# Patient Record
Sex: Female | Born: 1985 | Race: Black or African American | Hispanic: No | Marital: Married | Smoking: Never smoker
Health system: Southern US, Community
[De-identification: ages and names within clinical notes are randomized; demographics above are authoritative.]

## PROBLEM LIST (undated history)

## (undated) ENCOUNTER — Inpatient Hospital Stay (HOSPITAL_COMMUNITY): Payer: Self-pay

## (undated) DIAGNOSIS — F32A Depression, unspecified: Secondary | ICD-10-CM

## (undated) DIAGNOSIS — E7800 Pure hypercholesterolemia, unspecified: Secondary | ICD-10-CM

## (undated) DIAGNOSIS — F419 Anxiety disorder, unspecified: Secondary | ICD-10-CM

## (undated) DIAGNOSIS — IMO0002 Reserved for concepts with insufficient information to code with codable children: Secondary | ICD-10-CM

## (undated) HISTORY — DX: Reserved for concepts with insufficient information to code with codable children: IMO0002

## (undated) HISTORY — DX: Depression, unspecified: F32.A

## (undated) HISTORY — DX: Pure hypercholesterolemia, unspecified: E78.00

## (undated) HISTORY — DX: Anxiety disorder, unspecified: F41.9

---
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
## 2006-10-15 DIAGNOSIS — R87619 Unspecified abnormal cytological findings in specimens from cervix uteri: Secondary | ICD-10-CM

## 2006-10-15 DIAGNOSIS — IMO0002 Reserved for concepts with insufficient information to code with codable children: Secondary | ICD-10-CM

## 2006-10-15 HISTORY — DX: Unspecified abnormal cytological findings in specimens from cervix uteri: R87.619

## 2006-10-15 HISTORY — DX: Reserved for concepts with insufficient information to code with codable children: IMO0002

## 2007-06-13 ENCOUNTER — Other Ambulatory Visit: Admission: RE | Admit: 2007-06-13 | Discharge: 2007-06-13 | Payer: Self-pay | Admitting: Family Medicine

## 2008-01-08 ENCOUNTER — Emergency Department (HOSPITAL_COMMUNITY): Admission: EM | Admit: 2008-01-08 | Discharge: 2008-01-08 | Payer: Self-pay | Admitting: Family Medicine

## 2008-11-09 ENCOUNTER — Emergency Department (HOSPITAL_COMMUNITY): Admission: EM | Admit: 2008-11-09 | Discharge: 2008-11-09 | Payer: Self-pay | Admitting: Emergency Medicine

## 2009-06-01 ENCOUNTER — Encounter (INDEPENDENT_AMBULATORY_CARE_PROVIDER_SITE_OTHER): Payer: Self-pay | Admitting: Obstetrics and Gynecology

## 2009-06-01 ENCOUNTER — Inpatient Hospital Stay (HOSPITAL_COMMUNITY): Admission: AD | Admit: 2009-06-01 | Discharge: 2009-06-03 | Payer: Self-pay | Admitting: Obstetrics and Gynecology

## 2011-01-20 LAB — COMPREHENSIVE METABOLIC PANEL
AST: 23 U/L (ref 0–37)
AST: 24 U/L (ref 0–37)
Albumin: 2 g/dL — ABNORMAL LOW (ref 3.5–5.2)
Albumin: 2.7 g/dL — ABNORMAL LOW (ref 3.5–5.2)
Alkaline Phosphatase: 93 U/L (ref 39–117)
BUN: 5 mg/dL — ABNORMAL LOW (ref 6–23)
CO2: 24 mEq/L (ref 19–32)
Calcium: 8.1 mg/dL — ABNORMAL LOW (ref 8.4–10.5)
Chloride: 105 mEq/L (ref 96–112)
Creatinine, Ser: 0.54 mg/dL (ref 0.4–1.2)
Creatinine, Ser: 0.64 mg/dL (ref 0.4–1.2)
GFR calc Af Amer: >60 mL/min (ref >60)
GFR calc non Af Amer: >60 mL/min (ref >60)
GFR calc non Af Amer: >60 mL/min (ref >60)
Potassium: 4.3 mEq/L (ref 3.5–5.1)
Sodium: 138 mEq/L (ref 135–145)
Total Bilirubin: 0.4 mg/dL (ref 0.3–1.2)
Total Protein: 5.1 g/dL — ABNORMAL LOW (ref 6.0–8.3)

## 2011-01-20 LAB — CBC
Hemoglobin: 12.9 g/dL (ref 12.0–15.0)
MCHC: 33 g/dL (ref 30.0–36.0)
MCHC: 34.2 g/dL (ref 30.0–36.0)
MCV: 78.8 fL (ref 78.0–100.0)
MCV: 79.4 fL (ref 78.0–100.0)
Platelets: 122 10*3/uL — ABNORMAL LOW (ref 150–400)
RBC: 4.96 MIL/uL (ref 3.87–5.11)
RDW: 15.4 % (ref 11.5–15.5)
WBC: 8.7 10*3/uL (ref 4.0–10.5)

## 2011-01-20 LAB — URINALYSIS, DIPSTICK ONLY
Bilirubin Urine: NEGATIVE
Nitrite: NEGATIVE
Protein, ur: NEGATIVE mg/dL
Urobilinogen, UA: 0.2 mg/dL (ref 0.0–1.0)

## 2011-01-20 LAB — RPR: RPR Ser Ql: NONREACTIVE

## 2011-02-27 NOTE — H&P (Signed)
Pamela Hall, Pamela Hall                  ACCOUNT NO.:  [id number]   MEDICAL RECORD NO.:  [id number]          PATIENT TYPE:  MAT   LOCATION:  [facility]                          FACILITY:  [facility]   PHYSICIAN:  Naima A. Dillard, M.D. DATE OF BIRTH:  [birth year]   DATE OF ADMISSION:  06/01/2009  DATE OF DISCHARGE:                              HISTORY & PHYSICAL   Ms. Doles is a 25 year old single black female gravida 2, para 0-0-1-0  at 38-2/7 weeks per an [facility] of June 13, 2009, who presents with a chief  complaint of spontaneous rupture of membranes at 5:15 a.m. while at  work.  She reports clear fluid with some pink tinge, positive fetal  movement, positive contractions, positive GBS.  No PIH or UTI signs or  symptoms, no respiratory complaints or fever.  Positive nausea.  Followed by CNM service at [facility].  Her history is remarkable for;  1. Borderline blood pressure today since arrival and occasional      borderline at the office during third trimester.  2. GBS positive.  3. History of abnormal Pap in 2008, but colposcopy within normal      limits.  4. TAB x1.  5. History of some irregular cycles at times.  6. History of STDs.  7. 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  8. Obese.  9. First trimester spotting after intercourse.   PRENATAL LABS:  Blood type is O+, Rh antibody screen negative.  Sickle  cell screen negative.  RPR was nonreactive.  Rubella titer immune.  Hepatitis surface antigen negative, HIV nonreactive.  Hemoglobin at that  time which was November 22, 2008, was 13.3, hematocrit 40.2, platelets  167.  Pap was done on November 22, 2008, as well and it was within  normal limits and gonorrhea and chlamydia cultures were negative.  GBS  culture positive in her third trimester.  She did have an elevated  Glucola which was 138, however, her 3-hour Glucola was all within normal  limits.  Hemoglobin that was done at time of her Glucola was 12.4.   ALLERGIES:  SHE DENIES  MEDICATION OR LATEX ALLERGIES.  NO OTHER  SENSITIVITIES.   MENSTRUAL HISTORY:  Menarche at age 68.  Reported monthly cycles, but  sometimes irregular at times.  She reported an LMP of September 06, 2008,  giving her a best [facility] of June 13, 2009.   OBSTETRICAL HISTORY:  Rhett Bannister 1 was an induced abortion at [redacted] weeks  gestation in April 2002.  Gravida 2 is current pregnancy.   PAST MEDICAL HISTORY:  She is A positive.  Contraception in the past,  she has used Depo-Provera, Ortho Evra patch and birth control pills.  Abnormal Pap in 2008, colposcopy was normal and was done at [facility].  Treated for chlamydia in 2000.  Treated for Trichomonas in  2000.  She does have some issues with frequent yeast infections and  bacterial vaginosis.  She reports varicella as a child, normal childhood  illnesses.  Does report receiving hepatitis B vaccine for her career.  Her only surgery is wisdom teeth extraction x4.   GENETIC HISTORY:  Remarkable for father of the baby having sickle cell  trait.  The father of the baby's daughter has sickle cell disease.   FAMILY HISTORY:  Maternal grandmother pacemaker.  Her mom has had a  heart attack and that was at age 61.  Mom and maternal grandmother  hypertension and on medication.  Maternal grandmother type 2 diabetes.  Her mom had gestational diabetes.  Mom bipolar and schizophrenia.  Mom  has had issues with drug abuse, including illicit drug use, alcohol and  nicotine.   SOCIAL HISTORY:  She is a single [place] American female.  She reports  [place] affiliation for her faith.  The father of the baby's name is  Leeanne Rio, he is involved and supportive.  He has a high school  education and is a delivery driver.  The patient has her bachelor's  degree and is a Designer, jewellery in Neuro ICU at [employer].  She reported mixed drinks before pregnancy before knowing,  otherwise denied tobacco or illicit drug use.   HISTORY  OF PRESENT PREGNANCY:  She entered care on November 01, 2008, for  her new OB interview and was around [redacted] weeks pregnant.  She reported her  pre-gravid weight 203, height was 5 feet 8.  Her BMI is 30.9.  She had  some first trimester spotting after sex.  She returned for her new OB  workup on November 22, 2008.  Her weight was 211, blood pressure was  120/100 at that time, recheck was within normal limits.  Prenatal labs:  Pap and cultures were done that day as well.  Sickle cell screen and all  were negative.  She had her first trimester screen done on December 08, 2008, and it was within normal limits.  AFP was also done and was within  normal limits, it was done on December 24, 2008.  At 19-3/7 weeks, she had  her anatomy ultrasound.  SIUP, size was equal to her LMP dating.  They  are expecting a boy.  Cervical length 3.31 cm.  Estimated fetal weight  was 48th percentile.  AFI was normal.  Posterior placenta grade 0.  Part  of her cardiac anatomy was not seen at that time, so she did return at  23 weeks and was able to observe all of that anatomy and it was within  normal limits.  She had some trouble with insomnia and was prescribed  Ambien at 19 weeks.  She also complained of some facial and back acne  and was prescribed erythromycin gel.  She does desire Marina postpartum.  Plans to breast feed and does desire circ.  At 30 weeks, she did have  another borderline blood pressure of 134/80, recheck was again within  normal limits.  Her weight at that time was 223.  She had her Glucola  and it was elevated at 138.  She did have her 3-hour Glucola and it was  within normal limits.  Around 32 weeks, she did have another borderline  blood pressure of 130/70, complained of seeing some spots while she was  at work the night before, but no other PIH signs or symptoms.  She had  an ultrasound at 34/3 for size less than dates.  She was measuring about  a week behind and did have a normal ultrasound  with estimated fetal  weight in the 74th percentile.  Fluid was 55th percentile.  Cervix was  still 3.45 cm.  GBS was done at 36/3, it was positive.  Blood pressures  have been within normal limits following 34-week visit when it was  132/60, until her presentation today.   OBJECTIVE:  VITAL SIGNS:  On admission, her blood pressure was 148/82  initially, heart rate 88, temperature 97.6, respirations 20.  Recheck on  her blood pressure was 138/78, heart rate was 91.  CBC showed white  count 8.7, hemoglobin 12.9, hematocrit 39.1, platelets were 157.  Fetal  heart rate 130s, minimal to moderate variability, no decels, not yet  reactive.  Toco showed contractions every 2-3 minutes which were  moderate on palpation.  She did have good resting tone.  GENERAL:  She was in discomfort, but alert and oriented x3.  HEENT:  Within normal limits.  CARDIOVASCULAR:  Regular rate and rhythm without murmur.  LUNGS:  Clear to auscultation bilaterally.  ABDOMEN:  Soft, nontender and gravid.  PELVIC:  Positive ferning, clear fluid, pink tinge on her pad.  Cervix  was 3+ centimeters 80%, -1, vertex, anterior cervix.  EXTREMITIES:  1+ pitting edema bilateral lower extremities.  She was  hyperreflexic with one beat of clonus bilaterally.   IMPRESSION:  1. Intrauterine pregnancy at 38-2/7 weeks.  2. Early labor.  3. Spontaneous rupture of membranes at 5:15 a.m.  4. Group B strep positive.  5. Desires epidural.  6. Borderline blood pressures.   PLAN:  1. Admit to birthing suites with Dr. Normand Sloop as attending physician.  2. Routine L and D orders with the addition of CMET, LDH, uric acid to      her labs, as well as will send a UA after her Foley is placed      following epidural.  3. Epidural as soon as possible.  4. Continue to observe blood pressures closely.  5. Pitocin IUPC p.r.n. augmentation.  6. Penicillin G per GBS protocol.      Candice Soldotna, CNM      Naima A. Normand Sloop,  M.D.  Electronically Signed    [facility]/MEDQ  D:  06/01/2009  T:  06/01/2009  Job:  [id number]

## 2011-07-09 LAB — WET PREP, GENITAL
Clue Cells Wet Prep HPF POC: NONE SEEN
Trich, Wet Prep: NONE SEEN
Yeast Wet Prep HPF POC: NONE SEEN

## 2011-07-09 LAB — GC/CHLAMYDIA PROBE AMP, GENITAL
Chlamydia, DNA Probe: NEGATIVE
GC Probe Amp, Genital: NEGATIVE

## 2011-10-16 NOTE — L&D Delivery Note (Signed)
Delivery Note At 3:41 AM a viable female was delivered via  (Presentation: LOA).  Easy delivery of the head with nuchal arm, loose nuchal cord x 1 slipped,, easy delivery of the shoulders, bulb suction mouth and nares, baby placed on pt abdomen cord doubly clamped and cut by support person. APGAR: 9, 9; weight 5 lb 14.4 oz (2676 g).   Placenta status: Intact, Spontaneous 3 vessel. Plans PP BTL to leave epidural in.  Anesthesia: Epidural  Episiotomy:  Lacerations:  Suture Repair: 4.0 monocryl 1 stitch for good hemostasis Est. Blood Loss (mL):   Mom to postpartum.  Baby to rooming in.  Sylas Twombly 09/29/2012, 4:13 AM

## 2012-02-25 ENCOUNTER — Telehealth: Payer: Self-pay | Admitting: Obstetrics and Gynecology

## 2012-02-25 MED ORDER — ONDANSETRON 8 MG PO TBDP: 8.0000 mg | ORAL_TABLET | Freq: Three times a day (TID) | ORAL | Status: DC | PRN

## 2012-02-25 NOTE — Telephone Encounter (Signed)
Spoke with pt rgs msg pt ~ 6 weeks c/o nausea vomiting unable to keep anything down tries gingerale and saltines still no relief advised pt will consult with provider and call her back pt voice understanding. Consult with [facility] pt can have rx for Zofran 8 mg one po q 8 hrs disp 20 3 rf informed pt rx sent to pharm pt voice understanding

## 2012-02-25 NOTE — Telephone Encounter (Signed)
New ob/no cht/nurse pool

## 2012-03-07 ENCOUNTER — Ambulatory Visit (INDEPENDENT_AMBULATORY_CARE_PROVIDER_SITE_OTHER): Payer: BC Managed Care – PPO | Admitting: Obstetrics and Gynecology

## 2012-03-07 DIAGNOSIS — Z331 Pregnant state, incidental: Secondary | ICD-10-CM

## 2012-03-07 LAB — POCT URINALYSIS DIPSTICK
Bilirubin, UA: 1
Blood, UA: NEGATIVE
Nitrite, UA: NEGATIVE
Urobilinogen, UA: NEGATIVE
pH, UA: 5

## 2012-03-08 LAB — PRENATAL PANEL VII
Basophils Relative: 1 % (ref 0–1)
Eosinophils Absolute: 0.1 10*3/uL (ref 0.0–0.7)
Hemoglobin: 14.2 g/dL (ref 12.0–15.0)
Hepatitis B Surface Ag: NEGATIVE
MCH: 27.3 pg (ref 26.0–34.0)
MCHC: 34.1 g/dL (ref 30.0–36.0)
Monocytes Relative: 14 % — ABNORMAL HIGH (ref 3–12)
Neutrophils Relative %: 55 % (ref 43–77)
RDW: 13.8 % (ref 11.5–15.5)
Rh Type: POSITIVE

## 2012-03-09 LAB — CULTURE, OB URINE
Colony Count: NO GROWTH
Organism ID, Bacteria: NO GROWTH

## 2012-03-11 LAB — HEMOGLOBINOPATHY EVALUATION
Hemoglobin Other: 0 %
Hgb A2 Quant: 2.5 % (ref 2.2–3.2)
Hgb A: 97.5 % (ref 96.8–97.8)
Hgb S Quant: 0 %

## 2012-03-25 ENCOUNTER — Encounter: Payer: Self-pay | Admitting: Obstetrics and Gynecology

## 2012-03-25 ENCOUNTER — Ambulatory Visit (INDEPENDENT_AMBULATORY_CARE_PROVIDER_SITE_OTHER): Payer: BC Managed Care – PPO | Admitting: Obstetrics and Gynecology

## 2012-03-25 VITALS — BP 102/56 | Ht 68.0 in | Wt 226.0 lb

## 2012-03-25 DIAGNOSIS — Z331 Pregnant state, incidental: Secondary | ICD-10-CM | POA: Insufficient documentation

## 2012-03-25 DIAGNOSIS — B009 Herpesviral infection, unspecified: Secondary | ICD-10-CM | POA: Insufficient documentation

## 2012-03-25 DIAGNOSIS — Z2089 Contact with and (suspected) exposure to other communicable diseases: Secondary | ICD-10-CM

## 2012-03-25 LAB — POCT URINALYSIS DIPSTICK
Blood, UA: NEGATIVE
Ketones, UA: NEGATIVE
Protein, UA: NEGATIVE
Spec Grav, UA: 1.015
Urobilinogen, UA: NEGATIVE

## 2012-03-25 LAB — POCT WET PREP (WET MOUNT)
Clue Cells Wet Prep Whiff POC: POSITIVE
pH: 5

## 2012-03-25 MED ORDER — TERCONAZOLE 0.4 % VA CREA: 1.0000 | TOPICAL_CREAM | Freq: Every day | VAGINAL | Status: AC

## 2012-03-25 MED ORDER — METRONIDAZOLE 500 MG PO TABS: 500.0000 mg | ORAL_TABLET | Freq: Two times a day (BID) | ORAL | Status: AC

## 2012-03-25 NOTE — Progress Notes (Signed)
Last pap: 10/2011-WNL C/O nausea, vomiting & irritability Declines genetic testing

## 2012-03-26 ENCOUNTER — Encounter: Payer: Self-pay | Admitting: Obstetrics and Gynecology

## 2012-03-26 LAB — GC/CHLAMYDIA PROBE AMP, GENITAL: Chlamydia, DNA Probe: NEGATIVE

## 2012-03-26 NOTE — Progress Notes (Signed)
Patient ID: Pamela Hall, female   DOB: 17-Apr-1986, 26 y.o.   MRN: [id number] Pamela Hall is a 26 y.o. female presenting for new ob visit. Planned pregnancy with certain LMP 3/24. 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 Not taking PNV yet has RX.   OB History    Grav Para Term Preterm Abortions TAB SAB Ect Mult Living   3 1 1  1           Past Medical History  Diagnosis Date  . Abnormal Pap smear 2008    COLPO;WAS NORMAL;LAST PAP 10/2011  . Infection     YEAST INF;NOT FREQ  . Infection     BV;MAY GET FREQ  . 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 Wisdom tooth extraction 2003    ALL 4 EXTRACTED   Family History: family history includes Anesthesia problems in her maternal grandmother; Bipolar disorder in her mother; Cancer in her maternal grandmother; Diabetes type II in her maternal grandmother; Heart attack in her mother; Heart disease in her maternal grandmother and mother; Hypertension in her maternal grandmother; and Schizophrenia in her mother. Social History:  reports that she has never smoked. She has never used smokeless tobacco. She reports that she does not drink alcohol or use illicit drugs.  @ROS @    Blood pressure 102/56, height 5\' 8"  (1.727 m), weight 226 lb (102.513 kg), last menstrual period 01/06/2012. Physical exam: Calm, no distress, HEENT wnl lungs clear bilaterally, AP RRR, breasts bilaterally no masses, dimpling, or drainage, abd soft, gravid, nt, bowel sounds active, abdomen nontender Normal hair distrubition mons pubis,  EGBUS WNL, sterile speculum exam,  vagina pink, moist normal rugae, uterus high cerix LTC, no cervical motion tenderness, No adnexal masses or tenderness Scant white discharge DTR +2 no clonus No edema to lower extremities Wet  prep +hyphae, +clue, neg trich Prenatal labs: ABO, Rh: O/POS/-- (05/24 1109) Antibody: NEG (05/24 1109) Rubella:   RPR: NON REAC (05/24 1109)  HBsAg: NEGATIVE (05/24 1109)  HIV: NON REACTIVE (05/24 1109)  GBS:    Pap Jan 2013 WNL Sickle cell neg Assessment/Plan: 11 IUP No ketonuria  GC/CHL pending F/O 2 weeks for FHTS. Declines Genetic testing, Rx flagyl, gynezole discussed may wait to take until N,V resolves, nutrition and po hydration reviewed regarding N,V and s/s to report if worsening. Collaboration with Dr. Normand Sloop. Pamela Hall 03/26/2012, 11:16 AM

## 2012-04-06 ENCOUNTER — Encounter (HOSPITAL_COMMUNITY): Payer: Self-pay

## 2012-04-06 ENCOUNTER — Inpatient Hospital Stay (HOSPITAL_COMMUNITY)
Admission: AD | Admit: 2012-04-06 | Discharge: 2012-04-06 | Disposition: A | Discharge status: Home / Self Care | Payer: BC Managed Care – HMO | Source: Ambulatory Visit | Attending: Obstetrics and Gynecology | Admitting: Obstetrics and Gynecology

## 2012-04-06 DIAGNOSIS — O99891 Other specified diseases and conditions complicating pregnancy: Secondary | ICD-10-CM | POA: Insufficient documentation

## 2012-04-06 DIAGNOSIS — R1030 Lower abdominal pain, unspecified: Secondary | ICD-10-CM

## 2012-04-06 DIAGNOSIS — N949 Unspecified condition associated with female genital organs and menstrual cycle: Secondary | ICD-10-CM

## 2012-04-06 DIAGNOSIS — Z331 Pregnant state, incidental: Secondary | ICD-10-CM

## 2012-04-06 DIAGNOSIS — R109 Unspecified abdominal pain: Secondary | ICD-10-CM | POA: Insufficient documentation

## 2012-04-06 LAB — URINALYSIS, ROUTINE W REFLEX MICROSCOPIC
Glucose, UA: NEGATIVE mg/dL
Hgb urine dipstick: NEGATIVE
Ketones, ur: NEGATIVE mg/dL
Leukocytes, UA: NEGATIVE
Protein, ur: NEGATIVE mg/dL
pH: 8 (ref 5.0–8.0)

## 2012-04-06 NOTE — Discharge Instructions (Signed)
ABCs of Pregnancy A Antepartum care is very important. Be sure you see your doctor and get prenatal care as soon as you think you are pregnant. At this time, you will be tested for infection, genetic abnormalities and potential problems with you and the pregnancy. This is the time to discuss diet, exercise, work, medications, labor, pain medication during labor and the possibility of a cesarean delivery. Ask any questions that may concern you. It is important to see your doctor regularly throughout your pregnancy. Avoid exposure to toxic substances and chemicals - such as cleaning solvents, lead and mercury, some insecticides, and paint. Pregnant women should avoid exposure to paint fumes, and fumes that cause you to feel ill, dizzy or faint. When possible, it is a good idea to have a pre-pregnancy consultation with your caregiver to begin some important recommendations your caregiver suggests such as, taking folic acid, exercising, quitting smoking, avoiding alcoholic beverages, etc. B Breastfeeding is the healthiest choice for both you and your baby. It has many nutritional benefits for the baby and health benefits for the mother. It also creates a very tight and loving bond between the baby and mother. Talk to your doctor, your family and friends, and your employer about how you choose to feed your baby and how they can support you in your decision. Not all birth defects can be prevented, but a woman can take actions that may increase her chance of having a healthy baby. Many birth defects happen very early in pregnancy, sometimes before a woman even knows she is pregnant. Birth defects or abnormalities of any child in your or the father's family should be discussed with your caregiver. Get a good support bra as your breast size changes. Wear it especially when you exercise and when nursing.  C Celebrate the news of your pregnancy with the your spouse/father and family. Childbirth classes are helpful to  take for you and the spouse/father because it helps to understand what happens during the pregnancy, labor and delivery. Cesarean delivery should be discussed with your doctor so you are prepared for that possibility. The pros and cons of circumcision if it is a boy, should be discussed with your pediatrician. Cigarette smoking during pregnancy can result in low birth weight babies. It has been associated with infertility, miscarriages, tubal pregnancies, infant death (mortality) and poor health (morbidity) in childhood. Additionally, cigarette smoking may cause long-term learning disabilities. If you smoke, you should try to quit before getting pregnant and not smoke during the pregnancy. Secondary smoke may also harm a mother and her developing baby. It is a good idea to ask people to stop smoking around you during your pregnancy and after the baby is born. Extra calcium is necessary when you are pregnant and is found in your prenatal vitamin, in dairy products, green leafy vegetables and in calcium supplements. D A healthy diet according to your current weight and height, along with vitamins and mineral supplements should be discussed with your caregiver. Domestic abuse or violence should be made known to your doctor right away to get the situation corrected. Drink more water when you exercise to keep hydrated. Discomfort of your back and legs usually develops and progresses from the middle of the second trimester through to delivery of the baby. This is because of the enlarging baby and uterus, which may also affect your balance. Do not take illegal drugs. Illegal drugs can seriously harm the baby and you. Drink extra fluids (water is best) throughout pregnancy to help   your body keep up with the increases in your blood volume. Drink at least 6 to 8 glasses of water, fruit juice, or milk each day. A good way to know you are drinking enough fluid is when your urine looks almost like clear water or is very light  yellow.  E Eat healthy to get the nutrients you and your unborn baby need. Your meals should include the five basic food groups. Exercise (30 minutes of light to moderate exercise a day) is important and encouraged during pregnancy, if there are no medical problems or problems with the pregnancy. Exercise that causes discomfort or dizziness should be stopped and reported to your caregiver. Emotions during pregnancy can change from being ecstatic to depression and should be understood by you, your partner and your family. F Fetal screening with ultrasound, amniocentesis and monitoring during pregnancy and labor is common and sometimes necessary. Take 400 micrograms of folic acid daily both before, when possible, and during the first few months of pregnancy to reduce the risk of birth defects of the brain and spine. All women who could possibly become pregnant should take a vitamin with folic acid, every day. It is also important to eat a healthy diet with fortified foods (enriched grain products, including cereals, rice, breads, and pastas) and foods with natural sources of folate (orange juice, green leafy vegetables, beans, peanuts, broccoli, asparagus, peas, and lentils). The father should be involved with all aspects of the pregnancy including, the prenatal care, childbirth classes, labor, delivery, and postpartum time. Fathers may also have emotional concerns about being a father, financial needs, and raising a family. G Genetic testing should be done appropriately. It is important to know your family and the father's history. If there have been problems with pregnancies or birth defects in your family, report these to your doctor. Also, genetic counselors can talk with you about the information you might need in making decisions about having a family. You can call a major medical center in your area for help in finding a board-certified genetic counselor. Genetic testing and counseling should be done  before pregnancy when possible, especially if there is a history of problems in the mother's or father's family. Certain ethnic backgrounds are more at risk for genetic defects. H Get familiar with the hospital where you will be having your baby. Get to know how long it takes to get there, the labor and delivery area, and the hospital procedures. Be sure your medical insurance is accepted there. Get your home ready for the baby including, clothes, the baby's room (when possible), furniture and car seat. Hand washing is important throughout the day, especially after handling raw meat and poultry, changing the baby's diaper or using the bathroom. This can help prevent the spread of many bacteria and viruses that cause infection. Your hair may become dry and thinner, but will return to normal a few weeks after the baby is born. Heartburn is a common problem that can be treated by taking antacids recommended by your caregiver, eating smaller meals 5 or 6 times a day, not drinking liquids when eating, drinking between meals and raising the head of your bed 2 to 3 inches. I Insurance to cover you, the baby, doctor and hospital should be reviewed so that you will be prepared to pay any costs not covered by your insurance plan. If you do not have medical insurance, there are usually clinics and services available for you in your community. Take 30 milligrams of iron during   your pregnancy as prescribed by your doctor to reduce the risk of low red blood cells (anemia) later in pregnancy. All women of childbearing age should eat a diet rich in iron. J There should be a joint effort for the mother, father and any other children to adapt to the pregnancy financially, emotionally, and psychologically during the pregnancy. Join a support group for moms-to-be. Or, join a class on parenting or childbirth. Have the family participate when possible. K Know your limits. Let your caregiver know if you experience any of the  following:   Pain of any kind.   Strong cramps.   You develop a lot of weight in a short period of time (5 pounds in 3 to 5 days).   Vaginal bleeding, leaking of amniotic fluid.   Headache, vision problems.   Dizziness, fainting, shortness of breath.   Chest pain.   Fever of 102 F (38.9 C) or higher.   Gush of clear fluid from your vagina.   Painful urination.   Domestic violence.   Irregular heartbeat (palpitations).   Rapid beating of the heart (tachycardia).   Constant feeling sick to your stomach (nauseous) and vomiting.   Trouble walking, fluid retention (edema).   Muscle weakness.   If your baby has decreased activity.   Persistent diarrhea.   Abnormal vaginal discharge.   Uterine contractions at 20-minute intervals.   Back pain that travels down your leg.  L Learn and practice that what you eat and drink should be in moderation and healthy for you and your baby. Legal drugs such as alcohol and caffeine are important issues for pregnant women. There is no safe amount of alcohol a woman can drink while pregnant. Fetal alcohol syndrome, a disorder characterized by growth retardation, facial abnormalities, and central nervous system dysfunction, is caused by a woman's use of alcohol during pregnancy. Caffeine, found in tea, coffee, soft drinks and chocolate, should also be limited. Be sure to read labels when trying to cut down on caffeine during pregnancy. More than 200 foods, beverages, and over-the-counter medications contain caffeine and have a high salt content! There are coffees and teas that do not contain caffeine. M Medical conditions such as diabetes, epilepsy, and high blood pressure should be treated and kept under control before pregnancy when possible, but especially during pregnancy. Ask your caregiver about any medications that may need to be changed or adjusted during pregnancy. If you are currently taking any medications, ask your caregiver if it  is safe to take them while you are pregnant or before getting pregnant when possible. Also, be sure to discuss any herbs or vitamins you are taking. They are medicines, too! Discuss with your doctor all medications, prescribed and over-the-counter, that you are taking. During your prenatal visit, discuss the medications your doctor may give you during labor and delivery. N Never be afraid to ask your doctor or caregiver questions about your health, the progress of the pregnancy, family problems, stressful situations, and recommendation for a pediatrician, if you do not have one. It is better to take all precautions and discuss any questions or concerns you may have during your office visits. It is a good idea to write down your questions before you visit the doctor. O Over-the-counter cough and cold remedies may contain alcohol or other ingredients that should be avoided during pregnancy. Ask your caregiver about prescription, herbs or over-the-counter medications that you are taking or may consider taking while pregnant.  P Physical activity during pregnancy can   benefit both you and your baby by lessening discomfort and fatigue, providing a sense of well-being, and increasing the likelihood of early recovery after delivery. Light to moderate exercise during pregnancy strengthens the belly (abdominal) and back muscles. This helps improve posture. Practicing yoga, walking, swimming, and cycling on a stationary bicycle are usually safe exercises for pregnant women. Avoid scuba diving, exercise at high altitudes (over 3000 feet), skiing, horseback riding, contact sports, etc. Always check with your doctor before beginning any kind of exercise, especially during pregnancy and especially if you did not exercise before getting pregnant. Q Queasiness, stomach upset and morning sickness are common during pregnancy. Eating a couple of crackers or dry toast before getting out of bed. Foods that you normally love may  make you feel sick to your stomach. You may need to substitute other nutritious foods. Eating 5 or 6 small meals a day instead of 3 large ones may make you feel better. Do not drink with your meals, drink between meals. Questions that you have should be written down and asked during your prenatal visits. R Read about and make plans to baby-proof your home. There are important tips for making your home a safer environment for your baby. Review the tips and make your home safer for you and your baby. Read food labels regarding calories, salt and fat content in the food. S Saunas, hot tubs, and steam rooms should be avoided while you are pregnant. Excessive high heat may be harmful during your pregnancy. Your caregiver will screen and examine you for sexually transmitted diseases and genetic disorders during your prenatal visits. Learn the signs of labor. Sexual relations while pregnant is safe unless there is a medical or pregnancy problem and your caregiver advises against it. T Traveling long distances should be avoided especially in the third trimester of your pregnancy. If you do have to travel out of state, be sure to take a copy of your medical records and medical insurance plan with you. You should not travel long distances without seeing your doctor first. Most airlines will not allow you to travel after 36 weeks of pregnancy. Toxoplasmosis is an infection caused by a parasite that can seriously harm an unborn baby. Avoid eating undercooked meat and handling cat litter. Be sure to wear gloves when gardening. Tingling of the hands and fingers is not unusual and is due to fluid retention. This will go away after the baby is born. U Womb (uterus) size increases during the first trimester. Your kidneys will begin to function more efficiently. This may cause you to feel the need to urinate more often. You may also leak urine when sneezing, coughing or laughing. This is due to the growing uterus pressing  against your bladder, which lies directly in front of and slightly under the uterus during the first few months of pregnancy. If you experience burning along with frequency of urination or bloody urine, be sure to tell your doctor. The size of your uterus in the third trimester may cause a problem with your balance. It is advisable to maintain good posture and avoid wearing high heels during this time. An ultrasound of your baby may be necessary during your pregnancy and is safe for you and your baby. V Vaccinations are an important concern for pregnant women. Get needed vaccines before pregnancy. Center for Disease Control (www.cdc.gov) has clear guidelines for the use of vaccines during pregnancy. Review the list, be sure to discuss it with your doctor. Prenatal vitamins are helpful   and healthy for you and the baby. Do not take extra vitamins except what is recommended. Taking too much of certain vitamins can cause overdose problems. Continuous vomiting should be reported to your caregiver. Varicose veins may appear especially if there is a family history of varicose veins. They should subside after the delivery of the baby. Support hose helps if there is leg discomfort. W Being overweight or underweight during pregnancy may cause problems. Try to get within 15 pounds of your ideal weight before pregnancy. Remember, pregnancy is not a time to be dieting! Do not stop eating or start skipping meals as your weight increases. Both you and your baby need the calories and nutrition you receive from a healthy diet. Be sure to consult with your doctor about your diet. There is a formula and diet plan available depending on whether you are overweight or underweight. Your caregiver or nutritionist can help and advise you if necessary. X Avoid X-rays. If you must have dental work or diagnostic tests, tell your dentist or physician that you are pregnant so that extra care can be taken. X-rays should only be taken when  the risks of not taking them outweigh the risk of taking them. If needed, only the minimum amount of radiation should be used. When X-rays are necessary, protective lead shields should be used to cover areas of the body that are not being X-rayed. Y Your baby loves you. Breastfeeding your baby creates a loving and very close bond between the two of you. Give your baby a healthy environment to live in while you are pregnant. Infants and children require constant care and guidance. Their health and safety should be carefully watched at all times. After the baby is born, rest or take a nap when the baby is sleeping. Z Get your ZZZs. Be sure to get plenty of rest. Resting on your side as often as possible, especially on your left side is advised. It provides the best circulation to your baby and helps reduce swelling. Try taking a nap for 30 to 45 minutes in the afternoon when possible. After the baby is born rest or take a nap when the baby is sleeping. Try elevating your feet for that amount of time when possible. It helps the circulation in your legs and helps reduce swelling.  Most information courtesy of the CDC. Document Released: 10/01/2005 Document Revised: 09/20/2011 Document Reviewed: 06/15/2009 [employer] Patient Information 2012 [employer]. 

## 2012-04-06 NOTE — MAU Note (Signed)
Reported to CNM patient G3P1 [redacted] weeks gestation, presents with c/o pressure and cramping for several days which has gotten progressively worse, denies vaginal bleeding or discharge, CNM to come to MAU to evaluate patient.

## 2012-04-06 NOTE — Progress Notes (Signed)
History   cramping for a week days "feels like when I was 36 weeks with my last pregnancy like it is all going to fall out, Nausea has improved, no vomiting, diarrhea, fever, no vag bleeding"  Chief Complaint  Patient presents with  . Abdominal Pain   @SFHPI @  OB History    Grav Para Term Preterm Abortions TAB SAB Ect Mult Living   3 1 1  1     1       Past Medical History  Diagnosis Date  . Abnormal Pap smear 2008    COLPO;WAS NORMAL;LAST PAP 10/2011  . Infection     YEAST INF;NOT FREQ  . Infection     BV;MAY GET FREQ  . Infection 2000    TRICH;WAS TREATED    Past Surgical History  Procedure Date  . Wisdom tooth extraction 2003    ALL 4 EXTRACTED    Family History  Problem Relation Age of Onset  . Schizophrenia Mother   . Bipolar disorder Mother   . Heart disease Mother   . Heart attack Mother   . Cancer Maternal Grandmother   . Diabetes type II Maternal Grandmother   . Hypertension Maternal Grandmother   . Heart disease Maternal Grandmother   . Anesthesia problems Maternal Grandmother     ALLERGIC TO GENERAL ANESTHESIA  . Other Maternal Grandmother     History  Substance Use Topics  . Smoking status: Never Smoker   . Smokeless tobacco: Never Used  . Alcohol Use: No     SOCIAL DRINKER    Allergies: No Known Allergies  Prescriptions prior to admission  Medication Sig Dispense Refill  . calcium carbonate (TUMS - DOSED IN MG ELEMENTAL CALCIUM) 500 MG chewable tablet Chew 2 tablets by mouth daily as needed. For heartburn      . Docosahexaenoic Acid (DHA OMEGA 3 PO) Take 1 capsule by mouth daily.      Marland Kitchen omeprazole (PRILOSEC) 20 MG capsule Take 20 mg by mouth once. For heartburn      . Prenatal Vit-Fe Fumarate-FA (PRENATAL MULTIVITAMIN) TABS Take 1 tablet by mouth daily.        @ROS @ Physical Exam  abd soft rounded nt on palpation, no CMT, no bleeding, cervix LTC  FHTS + Blood pressure 136/68, pulse 88, temperature 98.9 F (37.2 C), temperature source  Oral, resp. rate 18, last menstrual period 01/06/2012. A 13 week IUP P discharge home discussed s/s bleeding to report, if symptoms change or worsen, N,V,D, pain, tylenol, motrin PRN cramping, 8 water daily, frequent voids, f/o office as scheduled. [name], CNM

## 2012-04-06 NOTE — MAU Note (Signed)
Patient presents with c/o cramping and pressure for several days which has gotten progressively worse. [redacted] weeks gestation.

## 2012-04-10 ENCOUNTER — Encounter: Payer: BC Managed Care – PPO | Admitting: Obstetrics and Gynecology

## 2012-04-21 ENCOUNTER — Encounter: Payer: Self-pay | Admitting: Obstetrics and Gynecology

## 2012-04-21 ENCOUNTER — Ambulatory Visit (INDEPENDENT_AMBULATORY_CARE_PROVIDER_SITE_OTHER): Payer: BC Managed Care – PPO | Admitting: Obstetrics and Gynecology

## 2012-04-21 VITALS — BP 116/62 | Wt 227.0 lb

## 2012-04-21 DIAGNOSIS — B338 Other specified viral diseases: Secondary | ICD-10-CM

## 2012-04-21 DIAGNOSIS — Z331 Pregnant state, incidental: Secondary | ICD-10-CM

## 2012-04-21 DIAGNOSIS — IMO0002 Reserved for concepts with insufficient information to code with codable children: Secondary | ICD-10-CM

## 2012-04-21 DIAGNOSIS — B009 Herpesviral infection, unspecified: Secondary | ICD-10-CM

## 2012-04-21 NOTE — Progress Notes (Signed)
Pt  Is here for ROB follow up. Pt still declines genetic screening. No complaints today. Anatomy US and 1hr glucola NV

## 2012-05-19 ENCOUNTER — Other Ambulatory Visit: Payer: Self-pay | Admitting: Obstetrics and Gynecology

## 2012-05-19 ENCOUNTER — Ambulatory Visit (INDEPENDENT_AMBULATORY_CARE_PROVIDER_SITE_OTHER): Payer: BC Managed Care – PPO

## 2012-05-19 ENCOUNTER — Ambulatory Visit (INDEPENDENT_AMBULATORY_CARE_PROVIDER_SITE_OTHER): Payer: BC Managed Care – PPO | Admitting: Obstetrics and Gynecology

## 2012-05-19 VITALS — BP 120/64 | Wt 227.0 lb

## 2012-05-19 DIAGNOSIS — Z3490 Encounter for supervision of normal pregnancy, unspecified, unspecified trimester: Secondary | ICD-10-CM

## 2012-05-19 DIAGNOSIS — Z331 Pregnant state, incidental: Secondary | ICD-10-CM

## 2012-05-19 DIAGNOSIS — Z3689 Encounter for other specified antenatal screening: Secondary | ICD-10-CM

## 2012-05-19 LAB — US OB COMP + 14 WK

## 2012-05-19 NOTE — Progress Notes (Signed)
[redacted]w[redacted]d Anatomy USS today. USS result: Breech Presentation, Anterior Placenta, Fluid is normal (Vertical pocket = 5.1cm). Profile, Plate, Philtrum, Nasal Bone, Open hands, 5th Digit, Feet, Heel seen. Female. 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 F/U USS in 4 weeks. Normal Ovaries, No fluid in CDS, Normal Adnexas.

## 2012-05-28 ENCOUNTER — Inpatient Hospital Stay (HOSPITAL_COMMUNITY)
Admission: AD | Admit: 2012-05-28 | Discharge: 2012-05-29 | DRG: 886 | Disposition: A | Discharge status: Home / Self Care | Payer: BC Managed Care – PPO | Source: Ambulatory Visit | Attending: Obstetrics and Gynecology | Admitting: Obstetrics and Gynecology

## 2012-05-28 ENCOUNTER — Encounter (HOSPITAL_COMMUNITY): Payer: Self-pay | Admitting: *Deleted

## 2012-05-28 ENCOUNTER — Inpatient Hospital Stay (HOSPITAL_COMMUNITY): Payer: BC Managed Care – PPO

## 2012-05-28 DIAGNOSIS — O208 Other hemorrhage in early pregnancy: Secondary | ICD-10-CM

## 2012-05-28 DIAGNOSIS — O26879 Cervical shortening, unspecified trimester: Principal | ICD-10-CM | POA: Diagnosis present

## 2012-05-28 LAB — URINALYSIS, ROUTINE W REFLEX MICROSCOPIC
Ketones, ur: NEGATIVE mg/dL
Leukocytes, UA: NEGATIVE
Nitrite: NEGATIVE
Protein, ur: NEGATIVE mg/dL
pH: 6 (ref 5.0–8.0)

## 2012-05-28 NOTE — MAU Note (Signed)
Pt G3 P1 at 20.3wks feeling pressure, vaginal burning and light spotting.  Pt denies any problems with pregnancy, reports a lot of stress today.

## 2012-05-29 ENCOUNTER — Encounter (HOSPITAL_COMMUNITY): Payer: Self-pay | Admitting: *Deleted

## 2012-05-29 ENCOUNTER — Inpatient Hospital Stay (HOSPITAL_COMMUNITY): Payer: BC Managed Care – PPO

## 2012-05-29 DIAGNOSIS — O26879 Cervical shortening, unspecified trimester: Secondary | ICD-10-CM

## 2012-05-29 DIAGNOSIS — O208 Other hemorrhage in early pregnancy: Secondary | ICD-10-CM

## 2012-05-29 LAB — CBC
Hemoglobin: 12.1 g/dL (ref 12.0–15.0)
MCH: 26.9 pg (ref 26.0–34.0)
MCHC: 33.3 g/dL (ref 30.0–36.0)
RDW: 14 % (ref 11.5–15.5)

## 2012-05-29 LAB — GC/CHLAMYDIA PROBE AMP, GENITAL: GC Probe Amp, Genital: NEGATIVE

## 2012-05-29 MED ORDER — PRENATAL MULTIVITAMIN CH: 1.0000 | ORAL_TABLET | Freq: Every day | ORAL | Status: DC

## 2012-05-29 MED ORDER — ZOLPIDEM TARTRATE 5 MG PO TABS: 5.0000 mg | ORAL_TABLET | Freq: Every evening | ORAL | Status: DC | PRN

## 2012-05-29 MED ORDER — PANTOPRAZOLE SODIUM 40 MG PO TBEC
40.0000 mg | DELAYED_RELEASE_TABLET | Freq: Every day | ORAL | Status: DC
  Administered 2012-05-29: 40 mg via ORAL
  Filled 2012-05-29 (×2): qty 1

## 2012-05-29 MED ORDER — LACTATED RINGERS IV SOLN
INTRAVENOUS | Status: DC
  Administered 2012-05-29: 125 mL via INTRAVENOUS
  Administered 2012-05-29 (×2): via INTRAVENOUS

## 2012-05-29 MED ORDER — IBUPROFEN 600 MG PO TABS
600.0000 mg | ORAL_TABLET | Freq: Four times a day (QID) | ORAL | Status: DC
  Administered 2012-05-29 (×3): 600 mg via ORAL
  Filled 2012-05-29 (×3): qty 1

## 2012-05-29 MED ORDER — ACETAMINOPHEN 325 MG PO TABS: 650.0000 mg | ORAL_TABLET | ORAL | Status: DC | PRN

## 2012-05-29 MED ORDER — LACTATED RINGERS IV BOLUS (SEPSIS)
500.0000 mL | Freq: Once | INTRAVENOUS | Status: AC
Start: 2012-05-29 — End: 2012-05-29
  Administered 2012-05-29: 500 mL via INTRAVENOUS

## 2012-05-29 MED ORDER — PANTOPRAZOLE SODIUM 40 MG PO TBEC
40.0000 mg | DELAYED_RELEASE_TABLET | Freq: Every day | ORAL | Status: DC
  Filled 2012-05-29: qty 1

## 2012-05-29 MED ORDER — CALCIUM CARBONATE ANTACID 500 MG PO CHEW: 2.0000 | CHEWABLE_TABLET | ORAL | Status: DC | PRN

## 2012-05-29 MED ORDER — DOCUSATE SODIUM 100 MG PO CAPS: 100.0000 mg | ORAL_CAPSULE | Freq: Every day | ORAL | Status: DC

## 2012-05-29 MED ORDER — NALBUPHINE SYRINGE 5 MG/0.5 ML
10.0000 mg | INJECTION | INTRAMUSCULAR | Status: DC | PRN
  Administered 2012-05-29: 5 mg via INTRAVENOUS
  Filled 2012-05-29: qty 0.5
  Filled 2012-05-29 (×2): qty 1

## 2012-05-29 NOTE — Progress Notes (Signed)
Dr. Carney Bern in department. Stated Ms. Doles could come to u/s via wc felt her cervical exam and length did not warrant flat in the bed.  Notified Dr. Su Hilt and she agreed

## 2012-05-29 NOTE — Progress Notes (Signed)
UR Chart review completed.  

## 2012-05-29 NOTE — Progress Notes (Addendum)
Called by RN to evaluate 0900 late entry Resting quietly in bed head up on 3 pills, teary "feeling vaginal burning deep inside since sitting up, no leaking of fluid, no pressure like last evening, had felt this burning early last evening" abdomen soft, nt +FHTS  Wet prep neg  GC/CHL HSV pending A 20 4/7 week IUP cervical change P discussed rationale for trendelenburg and now in correct position, reviewed s/s srom, vag bleeding, pressure, abdominal pain to report. [name], CNM  VE performed ext os 1cm and int os closed.  Exam performed prior to MFM consult.  Dr. Otho Perl called and reported nl cervix on u/s and original u/s that was performed was done translabially and grossly inaccurate.  Recs d/c pt home to be treated with routine prenatal care.

## 2012-05-29 NOTE — H&P (Signed)
Pamela Hall is a 26 y.o. female presenting for c/o pelvic pressure, cramping, had spotting tonight, denies any leaking fluid or d/c. Has not tried any pain medications.   HPI: pt began [facility] at [facility] at 11wks. She declined any genetic screening. She then was seen in MAU at 13wks for pelvic pressure. She had an anatomy scan at [redacted]w[redacted]d, cervix at that time was 4.03cm. R CP cyst, and bilateral pyelectasis noted, and limited heart views noted. She denies any sx's of HSV, has not used valtrex during pregnancy.   Maternal Medical History:  Reason for admission: Reason for admission: nausea.  Short cervix   Contractions: Denies, c/o pelvic pressure  Fetal activity: Perceived fetal activity is normal.   Last perceived fetal movement was within the past 24 hours.    Prenatal complications: no prenatal complications   OB History    Grav Para Term Preterm Abortions TAB SAB Ect Mult Living   3 1 1  1     1      G1 - TAB G3 - FT SVD 2010   Past Medical History  Diagnosis Date  . Abnormal Pap smear 2008    COLPO;WAS NORMAL;LAST PAP 10/2011  . Infection     YEAST INF;NOT FREQ  . Infection     BV;MAY GET FREQ  . Infection 2000    TRICH;WAS TREATED   Past Surgical History  Procedure Date  . Wisdom tooth extraction 2003    ALL 4 EXTRACTED   Family History: family history includes Anesthesia problems in her maternal grandmother; Bipolar disorder in her mother; Cancer in her maternal grandmother; Diabetes type II in her maternal grandmother; Heart attack in her mother; Heart disease in her maternal grandmother and mother; Hypertension in her maternal grandmother; Other in her maternal grandmother; and Schizophrenia in her mother. Social History:  reports that she has never smoked. She has never used smokeless tobacco. She reports that she drinks alcohol. She reports that she does not use illicit drugs.  Pt is an ICU RN at [employer]   Prenatal Transfer Tool  Maternal Diabetes: No Genetic  Screening: Declined Maternal Ultrasounds/Referrals: Abnormal:  Findings:   Fetal renal pyelectasis, Other: Please see prenatal record for details Fetal Ultrasounds or other Referrals:  None Maternal Substance Abuse:  No Significant Maternal Medications:  None Significant Maternal Lab Results:  None Other Comments:  cervix 1.5cm in length upon admission  Review of Systems  Gastrointestinal: Positive for heartburn, nausea and abdominal pain. Negative for vomiting.  Genitourinary: Positive for urgency. Negative for dysuria.  All other systems reviewed and are negative.      Blood pressure 123/64, pulse 81, temperature 98.1 F (36.7 C), temperature source Oral, resp. rate 20, height 5\' 8"  (1.727 m), weight 229 lb 12.8 oz (104.237 kg), last menstrual period 01/06/2012. Maternal Exam:  Abdomen: Patient reports the following abdominal tenderness: suprapubic.  Fundal height is aga.   Fetal presentation: vertex  Introitus: Normal vulva. Vagina is positive for vaginal discharge.  Cervical mucous noted at os on spec exam ? Ulceration on cervix at 12 o'clock  VE - ext os 2cm/50/soft/-3   Pelvis: adequate for delivery.   Cervix: Cervix evaluated by sterile speculum exam and digital exam.     Fetal Exam Fetal Monitor Review: Mode: hand-held doppler probe.       Physical Exam  Nursing note and vitals reviewed. Constitutional: She is oriented to person, place, and time. She appears well-developed and well-nourished.  HENT:  Head: Normocephalic.  Neck:  Normal range of motion.  Cardiovascular: Normal rate, regular rhythm and normal heart sounds.   Respiratory: Effort normal and breath sounds normal.  GI: Soft. Bowel sounds are normal. She exhibits no distension. There is tenderness in the suprapubic area. There is no rebound.       Tender to deep palpation suprapubically   Genitourinary: Vaginal discharge found.  Musculoskeletal: Normal range of motion. She exhibits no edema.    Neurological: She is alert and oriented to person, place, and time.  Skin: Skin is warm and dry.  Psychiatric: She has a normal mood and affect. Her behavior is normal.    Prenatal labs: ABO, Rh: O/POS/-- (05/24 1109) Antibody: NEG (05/24 1109) Rubella: 11.0 (05/24 1109) RPR: NON REAC (05/24 1109)  HBsAg: NEGATIVE (05/24 1109)  HIV: NON REACTIVE (05/24 1109)  GBS:   collected on 8-14   Assessment/Plan: IUP at [redacted]w[redacted]d Shortened cervix Hx HSV UA WNL - will send for cx  Admit to antenatal per c/w Dr Normand Sloop  Trendelenburg Foley Motrin q 6 Wet prep - pending HSV cx - sent GC/CT - sent CBC IVF's Clear liquids Will evaluate for possible cerclage tmrw      Khalil Szczepanik M 05/29/2012, 1:54 AM    ---------

## 2012-05-29 NOTE — Consult Note (Signed)
MATERNAL FETAL MEDICINE CONSULT  Patient Name: Pamela Hall Medical Record Number:  [id number] Date of Birth: Mar 07, 1986 Requesting Physician Name:  Michael Litter, MD Date of Service: 05/29/2012  Chief Complaint Possible cervical shortening  History of Present Illness Pamela Hall was seen today for prenatal diagnosis secondary to possible cervical shortening at the request of Michael Litter, MD.  The patient is a 26 y.o. [id number],GE [redacted]w[redacted]d with an EDD of 10/12/2012, by Last Menstrual Period dating method.  She presented to the MAU late last evening with pelvic pressure and some vaginal spotting.  A translabial ultrasound was performed and reported a cervical length of 1.5 cm.  Her pelvic pressure has slightly improved since admission and she has had no further spotting.  She denies any leakage of fluid or contractions.  Review of Systems Pertinent items are noted in HPI.  Patient History OB History    Grav Para Term Preterm Abortions TAB SAB Ect Mult Living   3 1 1  1     1      # Outc Date GA Lbr Len/2nd Wgt Sex Del Anes PTL Lv   1 TRM 8/10 [redacted]w[redacted]d 10:00 6lb14oz(3.118kg) M SVD EPI     Comments: NO COMPLICATIONS   2 ABT  [redacted]w[redacted]d          Comments: NO COMPLICATIONS   3 CUR               Past Medical History  Diagnosis Date  . Abnormal Pap smear 2008    COLPO;WAS NORMAL;LAST PAP 10/2011  . Infection     YEAST INF;NOT FREQ  . Infection     BV;MAY GET FREQ  . Infection 2000    TRICH;WAS TREATED    Past Surgical History  Procedure Date  . Wisdom tooth extraction 2003    ALL 4 EXTRACTED    History   Social History  . Marital Status: Single    Spouse Name: Leeanne Rio    Number of Children: 1  . Years of Education: 17   Occupational History  . RN [employer]   Social History Main Topics  . Smoking status: Never Smoker   . Smokeless tobacco: Never Used  . Alcohol Use: 0.0 oz/week    1-2 Glasses of wine per week     SOCIAL DRINKER  . Drug Use: No    . Sexually Active: Yes -- Female partner(s)    Birth Control/ Protection: None     currently pregnant   Other Topics Concern  . None   Social History Narrative  . None    Family History  Problem Relation Age of Onset  . Schizophrenia Mother   . Bipolar disorder Mother   . Heart disease Mother   . Heart attack Mother   . Cancer Maternal Grandmother   . Diabetes type II Maternal Grandmother   . Hypertension Maternal Grandmother   . Heart disease Maternal Grandmother   . Anesthesia problems Maternal Grandmother     ALLERGIC TO GENERAL ANESTHESIA  . Other Maternal Grandmother    In addition, the patient has no family history of mental retardation, birth defects, or genetic diseases.  Physical Examination Filed Vitals:   05/29/12 1208  BP: 109/54  Pulse: 90  Temp:   Resp:    General appearance - alert, well appearing, and in no distress Abdomen - soft, nontender, nondistended, no masses or organomegaly Extremities - peripheral pulses normal, no pedal edema, no clubbing or cyanosis  Assessment and  Recommendations 1.  Normal cervical length.  A transvaginal ultrasound was performed in the [facility] which showed a cervical length of 3.7-4.5 cm without evidence of funneling or dynamic change.  The cervical length measurement obtained last evening on translabial scan was not accurate due to the difficulty of obtaining appropriate images of the cervix with that technique.  No further intervention or workup is required at this time.  The patient can be dismissed home if her fetal testing throughout today has been reassuring.  Rema Fendt, MD

## 2012-05-30 LAB — URINE CULTURE
Colony Count: NO GROWTH
Culture: NO GROWTH
Special Requests: 14

## 2012-06-02 LAB — HERPES SIMPLEX VIRUS CULTURE
Culture: NOT DETECTED
Special Requests: NORMAL

## 2012-06-09 NOTE — Discharge Summary (Signed)
Obstetric Discharge Summary Reason for Admission: shortened cervix on translabial ultrasound done in middle of night Prenatal Procedures: ultrasound Intrapartum Procedures: n/a Postpartum Procedures: n/a Complications-Operative and Postpartum: n/a Hemoglobin  Date Value Range Status  05/29/2012 12.1  12.0 - 15.0 g/dL Final     HCT  Date Value Range Status  05/29/2012 36.3  36.0 - 46.0 % Final    Physical Exam:  General: alert Gravid VE int os closed DVT Evaluation: No evidence of DVT seen on physical exam.  Discharge Diagnoses: Excellent with normal cervix per Dr. Otho Perl of MFM  Discharge Information: Date: 06/09/2012 Activity: unrestricted Diet: routine Medications: PNV Condition: excellent Instructions: refer to practice specific booklet and keep routine prenatal f/u Discharge to: home Follow-up Information    Follow up with [facility] in 2 weeks. (or call as needed with any questions or concerns)          Newborn Data: This patient has no babies on file. Home with n/a.  Purcell Nails 06/09/2012, 4:53 PM

## 2012-06-17 ENCOUNTER — Other Ambulatory Visit: Payer: Self-pay

## 2012-06-17 ENCOUNTER — Encounter: Payer: Self-pay | Admitting: Obstetrics and Gynecology

## 2012-06-17 ENCOUNTER — Ambulatory Visit (INDEPENDENT_AMBULATORY_CARE_PROVIDER_SITE_OTHER): Payer: BC Managed Care – PPO | Admitting: Obstetrics and Gynecology

## 2012-06-17 ENCOUNTER — Ambulatory Visit (INDEPENDENT_AMBULATORY_CARE_PROVIDER_SITE_OTHER): Payer: BC Managed Care – PPO

## 2012-06-17 VITALS — BP 106/60 | Wt 232.0 lb

## 2012-06-17 DIAGNOSIS — Z3689 Encounter for other specified antenatal screening: Secondary | ICD-10-CM

## 2012-06-17 DIAGNOSIS — Z331 Pregnant state, incidental: Secondary | ICD-10-CM

## 2012-06-17 NOTE — Progress Notes (Signed)
[redacted]w[redacted]d Pt c/o numbness and tingling in both hands.  Ultrasound shows:  SIUP  S=D     [place] EDD: 10/16/2012           AFI: n/a           Cervical length: 3.16 cm           Placenta localization: anterior           Fetal presentation: vertex                   Anatomy survey is normal           Gender : female Comments: Vertex Presentation. Anterior placenta. Normal fluid. AP pocket = 6.6 cm Normal linear growth. All cardiac anatomy is seen today and is WNLs. Fetal kidneys are normal today. No pyelectasis.  Cx closed. Normal adnexa.

## 2012-06-17 NOTE — Progress Notes (Signed)
Early glucola not done yet.  Pt chooses not to do today.

## 2012-06-17 NOTE — Progress Notes (Signed)
[redacted]w[redacted]d Ultrasound: normal cardiac anatomy and kidneys. 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

## 2012-06-18 LAB — US OB FOLLOW UP

## 2012-07-08 ENCOUNTER — Other Ambulatory Visit: Payer: BC Managed Care – PPO

## 2012-07-08 ENCOUNTER — Ambulatory Visit (INDEPENDENT_AMBULATORY_CARE_PROVIDER_SITE_OTHER): Payer: BC Managed Care – PPO | Admitting: Obstetrics and Gynecology

## 2012-07-08 VITALS — BP 110/62 | Wt 232.0 lb

## 2012-07-08 DIAGNOSIS — Z3490 Encounter for supervision of normal pregnancy, unspecified, unspecified trimester: Secondary | ICD-10-CM

## 2012-07-08 DIAGNOSIS — Z331 Pregnant state, incidental: Secondary | ICD-10-CM

## 2012-07-08 LAB — CBC
HCT: 36.9 % (ref 36.0–46.0)
Hemoglobin: 12.2 g/dL (ref 12.0–15.0)
MCH: 26.8 pg (ref 26.0–34.0)
RBC: 4.55 MIL/uL (ref 3.87–5.11)

## 2012-07-08 NOTE — Progress Notes (Signed)
[redacted]w[redacted]d Glucola, hemoglobin, RPR sent. Patient desires tubal sterilization.  We also discussed vasectomy. Blood type: O+. Return to office in 2 weeks. Dr. Stefano Gaul

## 2012-07-08 NOTE — Progress Notes (Signed)
Pt stated no issues today / Glucola given @ 11:28. Draw @ 12:28.

## 2012-07-09 LAB — RPR

## 2012-07-09 LAB — GLUCOSE TOLERANCE, 1 HOUR (50G) W/O FASTING: Glucose, 1 Hour GTT: 113 mg/dL (ref 70–140)

## 2012-07-17 ENCOUNTER — Telehealth: Payer: Self-pay | Admitting: Obstetrics and Gynecology

## 2012-07-17 NOTE — Telephone Encounter (Signed)
TC from pt. States having pain and pressure in lower abd and lower back pain x a few days.  Having tightening like contractions but thinks is  <4/hr. +FM No vaginal leakage. No UTI sx. Only drinking 3-4 cups water/day. Pt is currently leaving class.  Per V L, Pt to go home, rest,  Drink several glasses water, take Ibuprofen 600 mg q 6 hours. To monitor contractions and FM and call with # cont./hr. To call by 3:00 pm today with update or sooner if sx increase or decreased FM. Pt verbalizes comprehension.

## 2012-07-17 NOTE — Telephone Encounter (Signed)
TC to pt. States is feeling much better. Followed instructions that were given and only had 2 cont in 3 hours. +FM Having some pain and pressure in groin area. Explained may be round ligament pain. May continue Ibuprofen q 6 hours for next 24 hours. Try warm bath.  Continue increased water intake. To call with any increase in sx or concerns. Pt verbalizes comprehension.

## 2012-07-23 ENCOUNTER — Ambulatory Visit (INDEPENDENT_AMBULATORY_CARE_PROVIDER_SITE_OTHER): Payer: BC Managed Care – PPO | Admitting: Obstetrics and Gynecology

## 2012-07-23 VITALS — BP 110/62 | Wt 233.0 lb

## 2012-07-23 DIAGNOSIS — Z331 Pregnant state, incidental: Secondary | ICD-10-CM

## 2012-07-23 NOTE — Progress Notes (Signed)
[redacted]w[redacted]d.  Pt has no concerns today.   1 GTT Results = 113 Hemoglobin :12.2 RPR: Non Reactive

## 2012-07-23 NOTE — Progress Notes (Signed)
[redacted]w[redacted]d Glucola equals 113.  Hemoglobin was 12.2.  RPR nonreactive. Doing well. Return office in 2 weeks. Plans flu shot at work. Dr. Stefano Gaul

## 2012-08-04 ENCOUNTER — Telehealth: Payer: Self-pay | Admitting: Obstetrics and Gynecology

## 2012-08-04 NOTE — Telephone Encounter (Signed)
Tc to pt per telephone call. Pt woke up out of sleep with a nosebleed @ approx 11:00 this am. C/o headaches today. No visual changes or dizziness. Pt checked bp today=144/77 around 12:30p and rechecked bp presently=137/69. Pt states,"drinking a lot more water as directed at last ov". Pt still having slight nose bleed presently. Informed pt to pinch bridge of nose and tilt head down to minimize bldg and run cool humidifier. Informed pt to take Tylenol otc(Reg or XS) for ha's. Will consult with cnm on call for recs. Pt agrees.

## 2012-08-04 NOTE — Telephone Encounter (Signed)
Tc to pt per consult with VL. Pt to try remedies given to pt by Karren Burly. Pt to also use normal saline rinses after nosebleed stops in order to help with lubrication of nasal passages. Pt voices understanding. Rob appt sched 08/06/12.

## 2012-08-06 ENCOUNTER — Ambulatory Visit (INDEPENDENT_AMBULATORY_CARE_PROVIDER_SITE_OTHER): Payer: BC Managed Care – PPO | Admitting: Obstetrics and Gynecology

## 2012-08-06 VITALS — BP 120/60 | Wt 236.0 lb

## 2012-08-06 DIAGNOSIS — Z331 Pregnant state, incidental: Secondary | ICD-10-CM

## 2012-08-06 DIAGNOSIS — Z3490 Encounter for supervision of normal pregnancy, unspecified, unspecified trimester: Secondary | ICD-10-CM

## 2012-08-06 NOTE — Progress Notes (Signed)
[redacted]w[redacted]d No complaints except had a nose bleed.  Discussed what to do in future. FKCs RTO 2wks

## 2012-08-20 ENCOUNTER — Ambulatory Visit (INDEPENDENT_AMBULATORY_CARE_PROVIDER_SITE_OTHER): Payer: BC Managed Care – PPO | Admitting: Obstetrics and Gynecology

## 2012-08-20 ENCOUNTER — Encounter: Payer: Self-pay | Admitting: Obstetrics and Gynecology

## 2012-08-20 VITALS — BP 122/70 | Wt 235.0 lb

## 2012-08-20 DIAGNOSIS — Z331 Pregnant state, incidental: Secondary | ICD-10-CM

## 2012-08-20 NOTE — Addendum Note (Signed)
Addended by: Tim Lair on: 08/20/2012 03:33 PM   Modules accepted: Orders

## 2012-08-20 NOTE — Addendum Note (Signed)
Addended by: Tim Lair on: 08/20/2012 03:23 PM   Modules accepted: Orders

## 2012-08-20 NOTE — Progress Notes (Signed)
[redacted]w[redacted]d  Pt has no concerns today.

## 2012-08-20 NOTE — Progress Notes (Signed)
Patient ID: Pamela Hall, female   DOB: 1986/01/09, 26 y.o.   MRN: [id number]  [facility] called w lab resuls  FFN neg

## 2012-08-20 NOTE — Progress Notes (Signed)
[redacted]w[redacted]d The patient complains of abdominal discomfort and pressure.  She says she has normal bowel movements.  She denies dysuria. GC, Chlamydia, and beta strep, urine culture, fetal fibronectin sent. Patient will go home and rest.  I will call her with test results. Betamethasone and bedrest reviewed. Return to office in 1 weeks. Dr. Stefano Gaul

## 2012-08-21 ENCOUNTER — Telehealth: Payer: Self-pay | Admitting: Obstetrics and Gynecology

## 2012-08-21 DIAGNOSIS — O3430 Maternal care for cervical incompetence, unspecified trimester: Secondary | ICD-10-CM

## 2012-08-21 NOTE — Telephone Encounter (Signed)
Spoke with pt rgd msd. Pt stated she wanted her results from yesterday. Told pt that her ffn was neg and the gc/ct is still waiting for final results and same with the group b  Step. Advised pt she can call back tomorrow . pts voice understanding. bt cma

## 2012-08-21 NOTE — Telephone Encounter (Signed)
Left msg on pt's voice mail to call back rgd msg.  

## 2012-08-22 ENCOUNTER — Telehealth: Payer: Self-pay

## 2012-08-22 LAB — STREP B DNA PROBE: GBSP: POSITIVE

## 2012-08-22 NOTE — Telephone Encounter (Signed)
U/S scheduled per AVS  for 08-27-2012 @ 1pm Pt made aware and pt agreeable.  [facility] CMA

## 2012-08-23 LAB — CULTURE, OB URINE

## 2012-08-27 ENCOUNTER — Ambulatory Visit (INDEPENDENT_AMBULATORY_CARE_PROVIDER_SITE_OTHER): Payer: BC Managed Care – PPO | Admitting: Obstetrics and Gynecology

## 2012-08-27 ENCOUNTER — Ambulatory Visit (INDEPENDENT_AMBULATORY_CARE_PROVIDER_SITE_OTHER): Payer: BC Managed Care – PPO

## 2012-08-27 ENCOUNTER — Telehealth: Payer: Self-pay | Admitting: Obstetrics and Gynecology

## 2012-08-27 ENCOUNTER — Other Ambulatory Visit: Payer: Self-pay | Admitting: Obstetrics and Gynecology

## 2012-08-27 ENCOUNTER — Encounter: Payer: Self-pay | Admitting: Obstetrics and Gynecology

## 2012-08-27 VITALS — BP 130/60 | Wt 233.0 lb

## 2012-08-27 DIAGNOSIS — O3430 Maternal care for cervical incompetence, unspecified trimester: Secondary | ICD-10-CM

## 2012-08-27 DIAGNOSIS — O26879 Cervical shortening, unspecified trimester: Secondary | ICD-10-CM

## 2012-08-27 DIAGNOSIS — Z23 Encounter for immunization: Secondary | ICD-10-CM

## 2012-08-27 NOTE — Progress Notes (Signed)
Flu shot given

## 2012-08-27 NOTE — Progress Notes (Signed)
[redacted]w[redacted]d GBS positive EFW 4-6 62% AFI 13.2 cm vtx Ant placenta Pyelectasis resolved cx 1.3 cm Pt will return to work with lifting restrictions Pelvic rest until 36 weeks GBS discussed

## 2012-08-27 NOTE — Patient Instructions (Signed)
Group B Streptococcus Infection During Pregnancy  Group B streptococcus (GBS) is a type of bacteria often found in healthy women. GBS usually does not cause any symptoms or harm to healthy adult women, but the bacteria can make a baby very sick if it is passed to the baby during childbirth. GBS is not a sexually transmitted disease (STD). GBS is different from Group A streptococcus, the bacteria that causes "strep throat."  CAUSES   GBS bacteria can be found in the intestinal, reproductive, and urinary tracts of women. It can also be found in the female genital tract, most often in the vagina and rectal areas.   SYMPTOMS   In pregnancy, GBS can be in the following places:   Genital tract without symptoms.   Rectum without symptoms.   Urine with or without symptoms (asymptomatic bacteriuria).   Urinary symptoms can include pain, frequency, urgency, and blood with urination (cystitis).  Pregnant women who are infected with GBS are at increased risk of:   Early (premature) labor and delivery.   Prolonged rupture of the membranes.   Infection in the following places:   Bladder.   Kidneys (pyelonephritis).   Bag of waters or placenta (chorioamnionitis).   Uterus (endometritis) after delivery.  Newborns who are infected with GBS can develop:   Lung infection (pneumonia).   Blood infection (septicemia).   Infection of the lining of the brain and spinal cord (meningitis).  DIAGNOSIS   Diagnosis of GBS infection is made by screening tests done when you are 35 to [redacted] weeks pregnant. The test (culture) is an easy swab of the vagina and rectum. A sample of your urine might also be checked for the bacteria. Talk with your caregiver about a plan for labor if your test shows that you carry the GBS bacteria.  TREATMENT    If results of the culture are positive, showing that GBS is present, you likely will receive treatment with antibiotic medicines during labor. This will help prevent GBS from being passed to your baby. The antibiotics work only if they are given during labor. If treatment is given earlier in pregnancy, the bacteria may regrow and be present during labor. Tell your caregiver if you are allergic to penicillin or other antibiotics. Antibiotics are also given if:    Infection of the membranes (amnionitis) is suspected.   Labor begins or there is rupture of the membranes before 37 weeks of pregnancy and there is a high risk of delivering the baby.   The mother has a past history of giving birth to an infant with GBS infection.   The culture status is unknown (culture not performed or result not available) and there is:   Fever during labor.   Preterm labor (less than 37 weeks of pregnancy).   Prolonged rupture of membranes (18 hours or more).  Treatment of the mother during labor is not recommended when:   A planned cesarean delivery is done and there is no labor or ruptured membranes. This is true even if the mother tested positive for GBS.   There is a negative culture for GBS screening during the pregnancy, regardless of the risk factors during labor.  The infant will receive antibiotics if the infant tested positive for GBS or has signs and symptoms that suggest GBS infection is present. It is not recommended to routinely give antibiotics to infants whose mothers received antibiotic treatment during labor.  HOME CARE INSTRUCTIONS    Take all antibiotics as prescribed by your caregiver.     Only take medicine as directed by your caregiver.   Continue with prenatal visits and care.   Return for follow-up appointments and cultures.   Follow your caregiver's instructions.  SEEK MEDICAL CARE IF:    You have pain with urination.   You have frequent urination.   You have blood in your urine.   SEEK IMMEDIATE MEDICAL CARE IF:   You have a fever.   You have pain in the back, side, or uterus.   You have chills.   You have abdominal swelling (distension) or pain.   You have labor pains (contractions) every 10 minutes or more often.   You are leaking fluid or bleeding from your vagina.   You have pelvic pressure that feels like your baby is pushing down.   You have a low, dull backache.   You have cramps that feel like your period.   You have abdominal cramps with or without diarrhea.   You have repeated vomiting and diarrhea.   You have trouble breathing.   You develop confusion.   You have stiffness of your body or neck.  MAKE SURE YOU:    Understand these instructions.   Will watch your condition.   Will get help right away if you are not doing well or get worse.  Document Released: 01/08/2008 Document Revised: 12/24/2011 Document Reviewed: 02/11/2011  [employer] Patient Information 2013 [employer].

## 2012-08-27 NOTE — Telephone Encounter (Signed)
Patient called.  Pelvic fetal fibronectin is negative.  Dr. Stefano Gaul

## 2012-08-27 NOTE — Telephone Encounter (Signed)
Patient called.  Told fetal fibronectin is negative.  Dr. Stefano Gaul

## 2012-08-28 LAB — US OB FOLLOW UP

## 2012-08-28 LAB — US OB TRANSVAGINAL

## 2012-09-03 ENCOUNTER — Ambulatory Visit (INDEPENDENT_AMBULATORY_CARE_PROVIDER_SITE_OTHER): Payer: BC Managed Care – PPO | Admitting: Obstetrics and Gynecology

## 2012-09-03 VITALS — BP 122/72 | Wt 233.0 lb

## 2012-09-03 DIAGNOSIS — Z331 Pregnant state, incidental: Secondary | ICD-10-CM

## 2012-09-03 MED ORDER — VALACYCLOVIR HCL 500 MG PO TABS: 500.0000 mg | ORAL_TABLET | Freq: Every day | ORAL | Status: DC

## 2012-09-03 NOTE — Progress Notes (Signed)
[redacted]w[redacted]d Valtrex daily to start: reviewed Pt desires PP BTL: procedure reviewed with R&B and irreversibility and failure rate of 1/500 to 10/998

## 2012-09-03 NOTE — Progress Notes (Signed)
[redacted]w[redacted]d  Pt has no concerns today.      

## 2012-09-14 ENCOUNTER — Telehealth: Payer: Self-pay | Admitting: Obstetrics and Gynecology

## 2012-09-14 NOTE — Telephone Encounter (Signed)
TC from pt to report that she had 4-5 ctx the the last 2 hours. She denies VB, LOF, +FM. Explained that since she was 36wks, it's not unusual to be having more ctx, offered MAU visit, she declined at this time. Enc to call if ctx become closer or stronger, or w VB, LOF, dec FM.

## 2012-09-17 ENCOUNTER — Ambulatory Visit (INDEPENDENT_AMBULATORY_CARE_PROVIDER_SITE_OTHER): Payer: BC Managed Care – PPO | Admitting: Obstetrics and Gynecology

## 2012-09-17 VITALS — BP 100/56 | Wt 232.0 lb

## 2012-09-17 DIAGNOSIS — O09899 Supervision of other high risk pregnancies, unspecified trimester: Secondary | ICD-10-CM

## 2012-09-17 DIAGNOSIS — Z22330 Carrier of Group B streptococcus: Secondary | ICD-10-CM

## 2012-09-17 DIAGNOSIS — J069 Acute upper respiratory infection, unspecified: Secondary | ICD-10-CM

## 2012-09-17 DIAGNOSIS — Z331 Pregnant state, incidental: Secondary | ICD-10-CM

## 2012-09-17 DIAGNOSIS — O99820 Streptococcus B carrier state complicating pregnancy: Secondary | ICD-10-CM | POA: Insufficient documentation

## 2012-09-17 MED ORDER — HYDROCOD POLST-CHLORPHEN POLST 10-8 MG/5ML PO LQCR: 5.0000 mL | Freq: Two times a day (BID) | ORAL | Status: DC

## 2012-09-17 NOTE — Progress Notes (Signed)
Pt stated she has been having some irritation in vagina area with some white discharge. Nonproductive cough for the last 2 weeks unresponsive to Delsyn Tussionex rx'd Reviewed pos GBS Definitely wants postpartum tubal

## 2012-09-17 NOTE — Progress Notes (Deleted)
GBS POSITIVE

## 2012-09-18 ENCOUNTER — Encounter: Payer: Self-pay | Admitting: Obstetrics and Gynecology

## 2012-09-18 ENCOUNTER — Encounter (HOSPITAL_COMMUNITY): Payer: Self-pay | Admitting: *Deleted

## 2012-09-18 ENCOUNTER — Inpatient Hospital Stay (HOSPITAL_COMMUNITY)
Admission: AD | Admit: 2012-09-18 | Discharge: 2012-09-18 | Disposition: A | Discharge status: Home / Self Care | Payer: BC Managed Care – PPO | Source: Ambulatory Visit | Attending: Obstetrics and Gynecology | Admitting: Obstetrics and Gynecology

## 2012-09-18 DIAGNOSIS — O368190 Decreased fetal movements, unspecified trimester, not applicable or unspecified: Secondary | ICD-10-CM | POA: Insufficient documentation

## 2012-09-18 DIAGNOSIS — O4700 False labor before 37 completed weeks of gestation, unspecified trimester: Secondary | ICD-10-CM

## 2012-09-18 DIAGNOSIS — O479 False labor, unspecified: Secondary | ICD-10-CM | POA: Insufficient documentation

## 2012-09-18 MED ORDER — OXYCODONE-ACETAMINOPHEN 5-325 MG PO TABS: 1.0000 | ORAL_TABLET | ORAL | Status: DC | PRN

## 2012-09-18 MED ORDER — ZOLPIDEM TARTRATE 10 MG PO TABS: 10.0000 mg | ORAL_TABLET | Freq: Every evening | ORAL | Status: DC | PRN

## 2012-09-18 NOTE — Progress Notes (Addendum)
History  Pamela Hall is a 26 y.o. G3P1011 at [redacted]w[redacted]d   Chief Complaint  Patient presents with  . Decreased Fetal Movement  . Labor Eval  CC: Has been having irreg contractions, became stronger and closer this morning. Has not felt the baby move this morning. No bleeding or leaking.    105w4d   Chief Complaint  Patient presents with  . Decreased Fetal Movement  . Labor Eval   @SFHPI @  Prior to Admission medications   Medication Sig Start Date End Date Taking? Authorizing Provider  calcium carbonate (TUMS - DOSED IN MG ELEMENTAL CALCIUM) 500 MG chewable tablet Chew 2 tablets by mouth daily as needed. For heartburn   Yes Historical Provider, MD  Docosahexaenoic Acid (DHA OMEGA 3 PO) Take 1 capsule by mouth daily.   Yes Historical Provider, MD  Prenatal Vit-Fe Fumarate-FA (PRENATAL MULTIVITAMIN) TABS Take 1 tablet by mouth daily.   Yes Historical Provider, MD  pseudoephedrine (SUDAFED) 30 MG tablet Take 30 mg by mouth every 4 (four) hours as needed.   Yes Historical Provider, MD  ranitidine (ZANTAC) 75 MG tablet Take 75 mg by mouth 2 (two) times daily.   Yes Historical Provider, MD  valACYclovir (VALTREX) 500 MG tablet Take 1 tablet (500 mg total) by mouth daily. 09/03/12  Yes Esmeralda Arthur, MD  chlorpheniramine-HYDROcodone (TUSSIONEX PENNKINETIC ER) 10-8 MG/5ML LQCR Take 5 mLs by mouth every 12 (twelve) hours. 09/17/12   Hal Morales, MD    Patient Active Problem List  Diagnosis  . Normal pregnancy, incidental  . HSV-2 infection complicating pregnancy  . GBS (group B Streptococcus carrier), +RV culture, currently pregnant   Vitals:  Blood pressure 132/85, pulse 107, temperature 97.6 F (36.4 C), temperature source Oral, resp. rate 18, height 5\' 8"  (1.727 m), weight 105.688 kg (233 lb), last menstrual period 01/06/2012. OB History    Grav Para Term Preterm Abortions TAB SAB Ect Mult Living   3 1 1  1     1       Past Medical History  Diagnosis Date  . Abnormal Pap  smear 2008    COLPO;WAS NORMAL;LAST PAP 10/2011  . Infection     YEAST INF;NOT FREQ  . Infection     BV;MAY GET FREQ  . Infection 2000    TRICH;WAS TREATED    Past Surgical History  Procedure Date  . Wisdom tooth extraction 2003    ALL 4 EXTRACTED    Family History  Problem Relation Age of Onset  . Schizophrenia Mother   . Bipolar disorder Mother   . Heart disease Mother   . Heart attack Mother   . Cancer Maternal Grandmother   . Diabetes type II Maternal Grandmother   . Hypertension Maternal Grandmother   . Heart disease Maternal Grandmother   . Anesthesia problems Maternal Grandmother     ALLERGIC TO GENERAL ANESTHESIA  . Other Maternal Grandmother     History  Substance Use Topics  . Smoking status: Never Smoker   . Smokeless tobacco: Never Used  . Alcohol Use: 0.0 oz/week    1-2 Glasses of wine per week     Comment: SOCIAL DRINKER    Allergies: No Known Allergies  Prescriptions prior to admission  Medication Sig Dispense Refill  . calcium carbonate (TUMS - DOSED IN MG ELEMENTAL CALCIUM) 500 MG chewable tablet Chew 2 tablets by mouth daily as needed. For heartburn      . Docosahexaenoic Acid (DHA OMEGA 3 PO) Take 1 capsule  by mouth daily.      . Prenatal Vit-Fe Fumarate-FA (PRENATAL MULTIVITAMIN) TABS Take 1 tablet by mouth daily.      . pseudoephedrine (SUDAFED) 30 MG tablet Take 30 mg by mouth every 4 (four) hours as needed.      . ranitidine (ZANTAC) 75 MG tablet Take 75 mg by mouth 2 (two) times daily.      . valACYclovir (VALTREX) 500 MG tablet Take 1 tablet (500 mg total) by mouth daily.  30 tablet  11  . chlorpheniramine-HYDROcodone (TUSSIONEX PENNKINETIC ER) 10-8 MG/5ML LQCR Take 5 mLs by mouth every 12 (twelve) hours.  140 mL  0    @ROS @ Physical Exam   Blood pressure 132/85, pulse 107, temperature 97.6 F (36.4 C), temperature source Oral, resp. rate 18, height 5\' 8"  (1.727 m), weight 105.688 kg (233 lb), last menstrual period  01/06/2012.  @PHYSEXAMBYAGE2 @ Labs:  No results found for this or any previous visit (from the past 24 hour(s)). ASSESSMENT: Patient Active Problem List  Diagnosis  . Normal pregnancy, incidental  . HSV-2 infection complicating pregnancy  . GBS (group B Streptococcus carrier), +RV culture, currently pregnant   Physical Examination: Physical exam: Calm, no distress, HEENT wnl lungs clear bilaterally, AP RRR, abd soft, gravid, nt, bowel sounds active, abdomen nontender Fundal height 37 Normal hair distrubition mons pubis,  EGBUS perineum WNL, sterile speculum exam,  vagina pink, moist normal rugae, no HSV lesions Vag 4 80 -1/0 VTX Intact at 1030 DTR + 2  no clonus No edema to lower extremities FHTS  UC ED Course  Assessment/Plan [redacted]w[redacted]d GBS + Will reassess for cervical change Pamela Hall, CNM    Addendum: at 1400  FHR reactive cat 1 No cervical change Not in active labor Discharge home Keep scheduled appt rv'd labor and [facility]  Pamela Hall, CNM

## 2012-09-18 NOTE — MAU Note (Signed)
Has been having irreg contractions, became stronger and closer this morning.  Has not felt the baby move this morning.  No bleeding or leaking.

## 2012-09-21 ENCOUNTER — Inpatient Hospital Stay (HOSPITAL_COMMUNITY)
Admission: AD | Admit: 2012-09-21 | Discharge: 2012-09-21 | Disposition: A | Discharge status: Home / Self Care | Payer: BC Managed Care – PPO | Source: Ambulatory Visit | Attending: Obstetrics and Gynecology | Admitting: Obstetrics and Gynecology

## 2012-09-21 DIAGNOSIS — O479 False labor, unspecified: Secondary | ICD-10-CM

## 2012-09-21 DIAGNOSIS — B379 Candidiasis, unspecified: Secondary | ICD-10-CM

## 2012-09-21 DIAGNOSIS — R05 Cough: Secondary | ICD-10-CM | POA: Insufficient documentation

## 2012-09-21 DIAGNOSIS — R059 Cough, unspecified: Secondary | ICD-10-CM | POA: Insufficient documentation

## 2012-09-21 DIAGNOSIS — J069 Acute upper respiratory infection, unspecified: Secondary | ICD-10-CM | POA: Insufficient documentation

## 2012-09-21 DIAGNOSIS — O99891 Other specified diseases and conditions complicating pregnancy: Secondary | ICD-10-CM | POA: Insufficient documentation

## 2012-09-21 LAB — AMNISURE RUPTURE OF MEMBRANE (ROM) NOT AT ARMC: Amnisure ROM: NEGATIVE

## 2012-09-21 NOTE — MAU Note (Signed)
Patient stated she feels like her water might have brokend

## 2012-09-21 NOTE — MAU Provider Note (Signed)
History   Pamela Hall is a 26y.o. MBF who presents at 37 weeks for R/o ROM.  Pt called to report intermittent clear to white d/c, leaking for last day, and underwear more damp than usual.  No VB.  GFM.  No UTI or PIH s/s.  No fever.  Has had cough/congestion for last 2-3 weeks and is taking Tussionex and sudafed w/ little relief.  Accompanied by her son and husband.  Pt reports occ'l ctxs.  Cx at last office exam=4/80% per pt recall.  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   .. Patient Active Problem List  Diagnosis  . Normal pregnancy, incidental  . HSV-2 infection complicating pregnancy  . GBS (group B Streptococcus carrier), +RV culture, currently pregnant    CSN: [id number]  Arrival date and time: 09/21/12 2041   First Provider Initiated Contact with Patient 09/21/12 2154      Chief Complaint  Patient presents with  . Labor Eval   HPI  OB History    Grav Para Term Preterm Abortions TAB SAB Ect Mult Living   3 1 1  1     1       Past Medical History  Diagnosis Date  . Abnormal Pap smear 2008    COLPO;WAS NORMAL;LAST PAP 10/2011  . Infection     YEAST INF;NOT FREQ  . Infection     BV;MAY GET FREQ  . Infection 2000    TRICH;WAS TREATED    Past Surgical History  Procedure Date  . Wisdom tooth extraction 2003    ALL 4 EXTRACTED    Family History  Problem Relation Age of Onset  . Schizophrenia Mother   . Bipolar disorder Mother   . Heart disease Mother   . Heart attack Mother   . Cancer Maternal Grandmother   . Diabetes type II Maternal Grandmother   . Hypertension Maternal Grandmother   . Heart disease Maternal Grandmother   . Anesthesia problems Maternal Grandmother     ALLERGIC TO GENERAL ANESTHESIA  . Other Maternal Grandmother     History  Substance Use Topics  . Smoking status: Never Smoker   . Smokeless tobacco: Never Used  . Alcohol Use: 0.0 oz/week    1-2 Glasses of wine per week     Comment: SOCIAL DRINKER    Allergies: No Known  Allergies  Prescriptions prior to admission  Medication Sig Dispense Refill  . calcium carbonate (TUMS - DOSED IN MG ELEMENTAL CALCIUM) 500 MG chewable tablet Chew 2 tablets by mouth daily as needed. For heartburn      . Docosahexaenoic Acid (DHA OMEGA 3 PO) Take 1 capsule by mouth daily.      . Prenatal Vit-Fe Fumarate-FA (PRENATAL MULTIVITAMIN) TABS Take 1 tablet by mouth daily.      . pseudoephedrine (SUDAFED) 30 MG tablet Take 30 mg by mouth every 4 (four) hours as needed.      . ranitidine (ZANTAC) 75 MG tablet Take 75 mg by mouth 2 (two) times daily.      . valACYclovir (VALTREX) 500 MG tablet Take 1 tablet (500 mg total) by mouth daily.  30 tablet  11  . chlorpheniramine-HYDROcodone (TUSSIONEX PENNKINETIC ER) 10-8 MG/5ML LQCR Take 5 mLs by mouth every 12 (twelve) hours.  140 mL  0  . oxyCODONE-acetaminophen (ROXICET) 5-325 MG per tablet Take 1-2 tablets by mouth every 4 (four) hours as needed for pain.  15 tablet  0  . zolpidem (AMBIEN) 10 MG tablet Take 1 tablet (  10 mg total) by mouth at bedtime as needed for sleep.  15 tablet  0    ROS--see HPI above Physical Exam   Blood pressure 140/77, pulse 105, temperature 98.4 F (36.9 C), temperature source Oral, resp. rate 18, height 5\' 8"  (1.727 m), weight 236 lb (107.049 kg), last menstrual period 01/06/2012, SpO2 99.00%.  Physical Exam  Constitutional: She is oriented to person, place, and time. She appears well-developed and well-nourished. No distress.       General malaise  HENT:  Head: Normocephalic and atraumatic.  Eyes: Pupils are equal, round, and reactive to light.  Cardiovascular: Normal rate.   Respiratory: Effort normal.  GI: Soft.       gravid  Genitourinary:       4/80/-2, soft, multiparous cx; anterior, vtx, membranes palpated  Neurological: She is alert and oriented to person, place, and time.  Skin: Skin is warm and dry.  Psychiatric: Her behavior is normal. Judgment and thought content normal.   .. Results  for orders placed during the hospital encounter of 09/21/12 (from the past 24 hour(s))  AMNISURE RUPTURE OF MEMBRANE (ROM)     Status: Normal   Collection Time   09/21/12  9:33 PM      Component Value Range   Amnisure ROM NEGATIVE     MAU Course  Procedures 1. NST: 140, reactive, no decels, moderate variability; TOCO rare ctx  Assessment and Plan  1. [redacted]w[redacted]d 2. No s/s of rupture or labor 3. Reactive NST 4. Persisting URI  1. D/c home w/ labor and leakage precautions 2. Called in Diflucan 150mg  po x1 w/ 1RF to [facility] on [address]; pt declined medicine for sleep or therapeutic rest. 3.  F/u 09/25/12, or prn Miciah Shealy H 09/21/2012, 10:00 PM

## 2012-09-21 NOTE — MAU Note (Signed)
Pt states has been leaking a little bit of clear to white fluid since yesterday

## 2012-09-25 ENCOUNTER — Encounter: Payer: Self-pay | Admitting: Obstetrics and Gynecology

## 2012-09-25 ENCOUNTER — Ambulatory Visit (INDEPENDENT_AMBULATORY_CARE_PROVIDER_SITE_OTHER): Payer: BC Managed Care – PPO | Admitting: Obstetrics and Gynecology

## 2012-09-25 VITALS — BP 120/52 | Wt 233.0 lb

## 2012-09-25 DIAGNOSIS — Z331 Pregnant state, incidental: Secondary | ICD-10-CM

## 2012-09-25 NOTE — Patient Instructions (Signed)

## 2012-09-25 NOTE — Progress Notes (Signed)
[redacted]w[redacted]d A/P GBS positive Fetal kick counts reviewed Labor reviewed with pt All patients  questions answered

## 2012-09-25 NOTE — Progress Notes (Signed)
Pt stated no issues today. Pt wants a cervix check today.

## 2012-09-28 ENCOUNTER — Telehealth: Payer: Self-pay | Admitting: Obstetrics and Gynecology

## 2012-09-28 ENCOUNTER — Inpatient Hospital Stay (HOSPITAL_COMMUNITY)
Admission: AD | Admit: 2012-09-28 | Discharge: 2012-10-01 | DRG: 374 | Disposition: A | Discharge status: Home / Self Care | Payer: BC Managed Care – PPO | Source: Ambulatory Visit | Attending: Obstetrics and Gynecology | Admitting: Obstetrics and Gynecology

## 2012-09-28 DIAGNOSIS — Z22330 Carrier of Group B streptococcus: Secondary | ICD-10-CM

## 2012-09-28 DIAGNOSIS — J069 Acute upper respiratory infection, unspecified: Secondary | ICD-10-CM

## 2012-09-28 DIAGNOSIS — Z302 Encounter for sterilization: Secondary | ICD-10-CM

## 2012-09-28 DIAGNOSIS — O99892 Other specified diseases and conditions complicating childbirth: Principal | ICD-10-CM | POA: Diagnosis present

## 2012-09-28 LAB — CBC
Hemoglobin: 12 g/dL (ref 12.0–15.0)
RBC: 4.93 MIL/uL (ref 3.87–5.11)

## 2012-09-28 MED ORDER — EPHEDRINE 5 MG/ML INJ: 10.0000 mg | INTRAVENOUS | Status: DC | PRN

## 2012-09-28 MED ORDER — FENTANYL 2.5 MCG/ML BUPIVACAINE 1/10 % EPIDURAL INFUSION (WH - ANES)
14.0000 mL/h | INTRAMUSCULAR | Status: DC
  Administered 2012-09-29: 14 mL/h via EPIDURAL
  Filled 2012-09-28: qty 125

## 2012-09-28 MED ORDER — IBUPROFEN 600 MG PO TABS: 600.0000 mg | ORAL_TABLET | Freq: Four times a day (QID) | ORAL | Status: DC | PRN

## 2012-09-28 MED ORDER — CITRIC ACID-SODIUM CITRATE 334-500 MG/5ML PO SOLN: 30.0000 mL | ORAL | Status: DC | PRN

## 2012-09-28 MED ORDER — PENICILLIN G POTASSIUM 5000000 UNITS IJ SOLR
5.0000 10*6.[IU] | Freq: Once | INTRAVENOUS | Status: AC
  Administered 2012-09-29: 5 10*6.[IU] via INTRAVENOUS
  Filled 2012-09-28: qty 5

## 2012-09-28 MED ORDER — DIPHENHYDRAMINE HCL 50 MG/ML IJ SOLN: 12.5000 mg | INTRAMUSCULAR | Status: DC | PRN

## 2012-09-28 MED ORDER — LACTATED RINGERS IV SOLN: 500.0000 mL | INTRAVENOUS | Status: DC | PRN

## 2012-09-28 MED ORDER — PENICILLIN G POTASSIUM 5000000 UNITS IJ SOLR
2.5000 10*6.[IU] | INTRAVENOUS | Status: DC
  Filled 2012-09-28 (×4): qty 2.5

## 2012-09-28 MED ORDER — OXYTOCIN 40 UNITS IN LACTATED RINGERS INFUSION - SIMPLE MED: 62.5000 mL/h | INTRAVENOUS | Status: DC

## 2012-09-28 MED ORDER — ACETAMINOPHEN 325 MG PO TABS: 650.0000 mg | ORAL_TABLET | ORAL | Status: DC | PRN

## 2012-09-28 MED ORDER — LACTATED RINGERS IV SOLN: 500.0000 mL | Freq: Once | INTRAVENOUS | Status: DC

## 2012-09-28 MED ORDER — LACTATED RINGERS IV SOLN: INTRAVENOUS | Status: DC

## 2012-09-28 MED ORDER — LIDOCAINE HCL (PF) 1 % IJ SOLN
30.0000 mL | INTRAMUSCULAR | Status: DC | PRN
  Filled 2012-09-28: qty 30

## 2012-09-28 MED ORDER — PHENYLEPHRINE 40 MCG/ML (10ML) SYRINGE FOR IV PUSH (FOR BLOOD PRESSURE SUPPORT)
80.0000 ug | PREFILLED_SYRINGE | INTRAVENOUS | Status: DC | PRN
  Filled 2012-09-28: qty 5

## 2012-09-28 MED ORDER — EPHEDRINE 5 MG/ML INJ
10.0000 mg | INTRAVENOUS | Status: DC | PRN
  Filled 2012-09-28: qty 4

## 2012-09-28 MED ORDER — PHENYLEPHRINE 40 MCG/ML (10ML) SYRINGE FOR IV PUSH (FOR BLOOD PRESSURE SUPPORT): 80.0000 ug | PREFILLED_SYRINGE | INTRAVENOUS | Status: DC | PRN

## 2012-09-28 MED ORDER — OXYCODONE-ACETAMINOPHEN 5-325 MG PO TABS: 1.0000 | ORAL_TABLET | ORAL | Status: DC | PRN

## 2012-09-28 MED ORDER — OXYTOCIN BOLUS FROM INFUSION: 500.0000 mL | INTRAVENOUS | Status: DC

## 2012-09-28 MED ORDER — ONDANSETRON HCL 4 MG/2ML IJ SOLN: 4.0000 mg | Freq: Four times a day (QID) | INTRAMUSCULAR | Status: DC | PRN

## 2012-09-28 NOTE — Telephone Encounter (Signed)
TC from patient in f/u to previous call. No further leaking--some mucus-y, stringy d/c. No organized contractions--more like pressure and low back pain. +FM  Will CTO--s/s of SROM and labor reviewed. Advised patient we can see her at any point, but that I would recommend observation at this point and f/u if more definitive sx occur. Patient agreeable with plan.

## 2012-09-28 NOTE — MAU Note (Signed)
To 163 for evaluation of ROM, report to Christine,RN

## 2012-09-28 NOTE — Telephone Encounter (Signed)
TC from patient--? Discharge when got up from bed.  Some cramping, but unsure of timing. +FM, no bleeding.  Plan: Start dry, put on pad, lie down for 20 min, then get up and walk around for 20 min. Time contractions Call with update in 1 hour.

## 2012-09-28 NOTE — MAU Note (Signed)
Pt reports ROM at 1500, contractions

## 2012-09-29 ENCOUNTER — Encounter (HOSPITAL_COMMUNITY): Payer: Self-pay | Admitting: Anesthesiology

## 2012-09-29 ENCOUNTER — Encounter (HOSPITAL_COMMUNITY): Payer: Self-pay | Admitting: *Deleted

## 2012-09-29 ENCOUNTER — Inpatient Hospital Stay (HOSPITAL_COMMUNITY): Payer: BC Managed Care – PPO | Admitting: Anesthesiology

## 2012-09-29 LAB — RPR: RPR Ser Ql: NONREACTIVE

## 2012-09-29 MED ORDER — SENNOSIDES-DOCUSATE SODIUM 8.6-50 MG PO TABS
2.0000 | ORAL_TABLET | Freq: Every day | ORAL | Status: DC
  Administered 2012-09-30: 2 via ORAL

## 2012-09-29 MED ORDER — WITCH HAZEL-GLYCERIN EX PADS: 1.0000 "application " | MEDICATED_PAD | CUTANEOUS | Status: DC | PRN

## 2012-09-29 MED ORDER — LANOLIN HYDROUS EX OINT: TOPICAL_OINTMENT | CUTANEOUS | Status: DC | PRN

## 2012-09-29 MED ORDER — HYDROCOD POLST-CHLORPHEN POLST 10-8 MG/5ML PO LQCR
5.0000 mL | Freq: Two times a day (BID) | ORAL | Status: DC | PRN
  Administered 2012-09-29 – 2012-09-30 (×2): 5 mL via ORAL
  Filled 2012-09-29 (×2): qty 5

## 2012-09-29 MED ORDER — ZOLPIDEM TARTRATE 5 MG PO TABS: 5.0000 mg | ORAL_TABLET | Freq: Every evening | ORAL | Status: DC | PRN

## 2012-09-29 MED ORDER — DIBUCAINE 1 % RE OINT: 1.0000 "application " | TOPICAL_OINTMENT | RECTAL | Status: DC | PRN

## 2012-09-29 MED ORDER — METOCLOPRAMIDE HCL 10 MG PO TABS
10.0000 mg | ORAL_TABLET | Freq: Once | ORAL | Status: AC
  Administered 2012-09-29: 10 mg via ORAL
  Filled 2012-09-29: qty 1

## 2012-09-29 MED ORDER — ONDANSETRON HCL 4 MG/2ML IJ SOLN: 4.0000 mg | INTRAMUSCULAR | Status: DC | PRN

## 2012-09-29 MED ORDER — LIDOCAINE HCL (PF) 1 % IJ SOLN
INTRAMUSCULAR | Status: DC | PRN
  Administered 2012-09-29 (×4): 4 mL

## 2012-09-29 MED ORDER — TETANUS-DIPHTH-ACELL PERTUSSIS 5-2.5-18.5 LF-MCG/0.5 IM SUSP: 0.5000 mL | Freq: Once | INTRAMUSCULAR | Status: DC

## 2012-09-29 MED ORDER — FAMOTIDINE 20 MG PO TABS
40.0000 mg | ORAL_TABLET | Freq: Once | ORAL | Status: AC
  Administered 2012-09-29: 40 mg via ORAL
  Filled 2012-09-29: qty 2

## 2012-09-29 MED ORDER — PRENATAL MULTIVITAMIN CH
1.0000 | ORAL_TABLET | Freq: Every day | ORAL | Status: DC
  Administered 2012-09-29 – 2012-10-01 (×2): 1 via ORAL
  Filled 2012-09-29 (×2): qty 1

## 2012-09-29 MED ORDER — BENZOCAINE-MENTHOL 20-0.5 % EX AERO
1.0000 "application " | INHALATION_SPRAY | CUTANEOUS | Status: DC | PRN
  Administered 2012-09-30: 1 via TOPICAL
  Filled 2012-09-29: qty 56

## 2012-09-29 MED ORDER — SIMETHICONE 80 MG PO CHEW
80.0000 mg | CHEWABLE_TABLET | ORAL | Status: DC | PRN
  Administered 2012-09-30 (×2): 80 mg via ORAL

## 2012-09-29 MED ORDER — DIPHENHYDRAMINE HCL 25 MG PO CAPS: 25.0000 mg | ORAL_CAPSULE | Freq: Four times a day (QID) | ORAL | Status: DC | PRN

## 2012-09-29 MED ORDER — PSEUDOEPHEDRINE HCL 30 MG PO TABS: 60.0000 mg | ORAL_TABLET | Freq: Four times a day (QID) | ORAL | Status: DC | PRN

## 2012-09-29 MED ORDER — IBUPROFEN 600 MG PO TABS
600.0000 mg | ORAL_TABLET | Freq: Four times a day (QID) | ORAL | Status: DC
  Administered 2012-09-29 – 2012-10-01 (×7): 600 mg via ORAL
  Filled 2012-09-29 (×7): qty 1

## 2012-09-29 MED ORDER — TERBUTALINE SULFATE 1 MG/ML IJ SOLN: 0.2500 mg | Freq: Once | INTRAMUSCULAR | Status: DC | PRN

## 2012-09-29 MED ORDER — LACTATED RINGERS IV SOLN: INTRAVENOUS | Status: DC

## 2012-09-29 MED ORDER — OXYTOCIN 40 UNITS IN LACTATED RINGERS INFUSION - SIMPLE MED
1.0000 m[IU]/min | INTRAVENOUS | Status: DC
  Administered 2012-09-29: 1 m[IU]/min via INTRAVENOUS
  Filled 2012-09-29: qty 1000

## 2012-09-29 MED ORDER — OXYCODONE-ACETAMINOPHEN 5-325 MG PO TABS
1.0000 | ORAL_TABLET | ORAL | Status: DC | PRN
  Administered 2012-09-29 – 2012-09-30 (×3): 1 via ORAL
  Filled 2012-09-29 (×3): qty 1

## 2012-09-29 MED ORDER — ONDANSETRON HCL 4 MG PO TABS: 4.0000 mg | ORAL_TABLET | ORAL | Status: DC | PRN

## 2012-09-29 NOTE — Progress Notes (Signed)
Comfortable with epidural O BP 143/84  Pulse 118  Temp 98.3 F (36.8 C) (Oral)  Resp 20  Ht 5\' 8"  (1.727 m)  Wt 236 lb (107.049 kg)  BMI 35.88 kg/m2  SpO2 100%  LMP 01/06/2012      fhts category 1      abd soft between uc      Contractions q 5 mod      Vag unchanged EFW 7#6 A [redacted]w[redacted]d    Not in active labor    GBS + P pt concurs with plan of care, continue care [name], CNM

## 2012-09-29 NOTE — H&P (Signed)
Pamela Hall is a 26 y.o. female presenting for srom and uc,  denies vag bleeding, no s/s HSV, taking valtrex daily, with +FM plans epidural, BTL Pg complicated with preterm dilitation History OB History    Grav Para Term Preterm Abortions TAB SAB Ect Mult Living   3 2 2  1     2     hx of 8/10 SVD 38 week 6#14  Past Medical History  Diagnosis Date  . Abnormal Pap smear 2008    COLPO;WAS NORMAL;LAST PAP 10/2011  . Infection     YEAST INF;NOT FREQ  . Infection     BV;MAY GET FREQ  . Infection 2000    TRICH;WAS TREATED   Past Surgical History  Procedure Date  . Wisdom tooth extraction 2003    ALL 4 EXTRACTED   Family History: family history includes Anesthesia problems in her maternal grandmother; Bipolar disorder in her mother; Cancer in her maternal grandmother; Diabetes type II in her maternal grandmother; Heart attack in her mother; Heart disease in her maternal grandmother and mother; Hypertension in her maternal grandmother; Other in her maternal grandmother; and Schizophrenia in her mother. Social History:  reports that she has never smoked. She has never used smokeless tobacco. She reports that she drinks alcohol. She reports that she does not use illicit drugs. Patient Active Problem List   Diagnosis Date Noted  . SVD (spontaneous vaginal delivery) 09/29/2012  . GBS (group B Streptococcus carrier), +RV culture, currently pregnant 09/17/2012  . Normal pregnancy, incidental 03/25/2012  . HSV-2 infection complicating pregnancy 03/25/2012     Prenatal Transfer Tool  Maternal Diabetes: No Genetic Screening: Declined Maternal Ultrasounds/Referrals: Normal f/o early [place] with bilateraly pylectasis resolved and R choroid plexus Fetal Ultrasounds or other Referrals:  None Maternal Substance Abuse:  No Significant Maternal Medications:  None Significant Maternal Lab Results:  Lab values include: Group B Strep positive Other Comments:  None  ROS Exam Physical Exam   Calm, no distress, cooperative, HEENT WNL grossly,  lungs clear bilaterally, AP RRR,  abd soft nt bowel sounds active,  EFW 7#8 Normal hair distrubition mons pubis,  EGBUS WNL,  sterile speculum exam grossly ruptured clear no blood vagina pink, moist normal rugae,  normal appearing cervix without discharge or lesions DTRS bilaterally + 1 trace edema to lower extremities Fhts category 1 UC irregular mod Prenatal labs: ABO, Rh: O/POS/-- (05/24 1109) Antibody: NEG (05/24 1109) Rubella: 11.0 (05/24 1109) RPR: NON REACTIVE (12/15 2323)  HBsAg: NEGATIVE (05/24 1109)  HIV: NON REACTIVE (05/24 1109)  GBS: POSITIVE (11/06 1628)   Assessment/Plan: [redacted]w[redacted]d Hx of HSV neg exam GBS positive Admit, Pen G, plans epidural, collaboration with Dr. Pennie Rushing. Late entry from 09/28/2012 admission to [facility] 09/29/2012, 9:49 PM

## 2012-09-29 NOTE — H&P (Deleted)
Pamela Hall is a 26 y.o. female, G3P2002 at 38 weeks, presenting for SROM at approx 3 pm on 09/28/12, with onset of labor several hours after that.  She presented to MAU at 11pm, with documented SROM and was 5 cm, vtx 100%, vtx -1.  She was admitted by [name], CNM.  She denies any HSV lesions or prodrome.  Patient Active Problem List  Diagnosis  . Normal pregnancy, incidental  . HSV-2 infection complicating pregnancy  . GBS (group B Streptococcus carrier), +RV culture, currently pregnant  . SVD (spontaneous vaginal delivery)    History of present pregnancy: Patient entered care at 11 2/7 weeks.   EDC of 10/12/12 was established by LMP.   Anatomy scan:  19 weeks, with normal findings and an ant placenta.  Normal cervical length noted. Additional [place] evaluations:  23 2/7 weeks, with cervix 3.16 cm, bilateral pyelectasis noted and right CP cyst.  Repeat US at 33 weeks, with normal growth, cx 1.3 cm, CP cyst and pyelectasis resolved, EFW 4+6.  Significant prenatal events:  Declined genetic testing.  Seen WHG at 20 weeks with cervix 1 cm ext os, internal os closed.  Evaluated for PTL at 32 weeks, with FFN done (negative).  Advised [place] at 34 weeks that she wanted a PPBTL--Dr. Rivard reviewed R&B.  Started daily Valtrex at 34 weeks. Last evaluation:  09/25/12 at office, WNL  OB History    Grav Para Term Preterm Abortions TAB SAB Ect Mult Living   3 2 2  1     2     TAB at 8 weeks 2010--SVB at 38 weeks, 10 hour labor, 6+14, female, epidural, at [facility]  Past Medical History  Diagnosis Date  . Abnormal Pap smear 2008    COLPO;WAS NORMAL;LAST PAP 10/2011  . Infection     YEAST INF;NOT FREQ  . Infection     BV;MAY GET FREQ  . Infection 2000    TRICH;WAS TREATED   Past Surgical History  Procedure Date  . Wisdom tooth extraction 2003    ALL 4 EXTRACTED   Family History: family history includes Anesthesia problems in her maternal grandmother; Bipolar disorder in her mother; Cancer  in her maternal grandmother; Diabetes type II in her maternal grandmother; Heart attack in her mother; Heart disease in her maternal grandmother and mother; Hypertension in her maternal grandmother; Other in her maternal grandmother; and Schizophrenia in her mother.  Social History:  reports that she has never smoked. She has never used smokeless tobacco. She reports that she drinks alcohol. She reports that she does not use illicit drugs.  FOB is Leeanne [place], involved and supportive.   Prenatal Transfer Tool  Maternal Diabetes: No Genetic Screening: Declined Maternal Ultrasounds/Referrals: Normal Fetal Ultrasounds or other Referrals:  None--hx of right CP cyst and bilateral pyelectasis, but these resolved by 32 weeks. Maternal Substance Abuse:  No Significant Maternal Medications:  None Significant Maternal Lab Results: Lab values include: Group B Strep positive    ROS:  Leaking clear fluid, UCs, +FM.  Denies HSV lesions or prodrome  No Known Allergies  VSS, afebrile on admission.  Chest clear Heart RRR without murmur Abd gravid, NT, FH 38 cm Pelvic: 5 cm, 100%, vtx, -1, leaking clear fluid with + amniosure Ext: WNL  FHR: Category 1 UCs:  q 3-4 min, moderate.  Prenatal labs: ABO, Rh: O/POS/-- (05/24 1109) Antibody: NEG (05/24 1109) Rubella:   Immune RPR: NON REACTIVE (12/15 2323)  HBsAg: NEGATIVE (05/24 1109)  HIV: NON  REACTIVE (05/24 1109)  GBS: POSITIVE (11/06 1628) Sickle cell/Hgb electrophoresis:  Neg Pap:  Jan 2013 GC:  Negative at NOB and at 32 weeks Chlamydia:  Negative at NOB and at 32 weeks Genetic screenings:  Declined Glucola:  WNL Hgb 12.2 at NOB, 12 at 28 weeks. Other:  HSV culture on 05/29/12--negative FFN at 32 weeks--negative.       Assessment/Plan: IUP at 38 weeks SROM Active labor + GBS Desires BTL  Plan: Admit to Birthing Suite per consult with Dr. Pennie Rushing Routine [facility] orders Plans epidural GBS Rx per routine regimen.  Nyra Capes, [place] 09/29/2012, 4:37 PM

## 2012-09-29 NOTE — Anesthesia Preprocedure Evaluation (Addendum)
Anesthesia Evaluation  Patient identified by MRN, date of birth, ID band Patient awake    Reviewed: Allergy & Precautions, H&P , NPO status , Patient's Chart, lab work & pertinent test results, reviewed documented beta blocker date and time   History of Anesthesia Complications Negative for: history of anesthetic complications  Airway Mallampati: I TM Distance: >3 FB Neck ROM: full    Dental  (+) Teeth Intact   Pulmonary neg pulmonary ROS,  breath sounds clear to auscultation        Cardiovascular negative cardio ROS  Rhythm:regular Rate:Normal     Neuro/Psych negative neurological ROS  negative psych ROS   GI/Hepatic negative GI ROS, Neg liver ROS,   Endo/Other  negative endocrine ROS  Renal/GU negative Renal ROS     Musculoskeletal negative musculoskeletal ROS (+)   Abdominal   Peds  Hematology negative hematology ROS (+)   Anesthesia Other Findings   Reproductive/Obstetrics negative OB ROS                          Anesthesia Physical Anesthesia Plan  ASA: II  Anesthesia Plan: Epidural   Post-op Pain Management:    Induction:   Airway Management Planned:   Additional Equipment:   Intra-op Plan:   Post-operative Plan:   Informed Consent: I have reviewed the patients History and Physical, chart, labs and discussed the procedure including the risks, benefits and alternatives for the proposed anesthesia with the patient or authorized representative who has indicated his/her understanding and acceptance.   Dental advisory given  Plan Discussed with: CRNA, Anesthesiologist and Surgeon  Anesthesia Plan Comments:        Anesthesia Quick Evaluation

## 2012-09-29 NOTE — Progress Notes (Signed)
Tubal initially scheduled for 4:30pm has been delayed due to OR issues. Reviewed issue with patient--per Dr. Estanislado Pandy, procedure can be r/s to 09/30/12 at 8:45 am with Dr. Normand Sloop. Patient in agreement. Will change diet to regular and allow to eat. Will be NPO after midnight. R&B of tubal reviewed again with patient, including bleeding, infection, damage to other organs, permanence of procedure, and risk of failure.  She seems to understand these risks and still wishes to proceed with plan for BTL tomorrow. H&P now in chart for pre-op.  Nigel Bridgeman, CNM, [place] 09/29/12 5:30pm

## 2012-09-29 NOTE — Progress Notes (Signed)
Denies headaches, sore throat, with clear nasal congestion and clear drainage, Frequent nonproductive cough HEENT wnl with exception nasal turbinates edematous not reddened. [name], CNM

## 2012-09-29 NOTE — Anesthesia Procedure Notes (Signed)
Epidural Patient location during procedure: OB Start time: 09/29/2012 1:51 AM  Staffing Performed by: anesthesiologist   Preanesthetic Checklist Completed: patient identified, site marked, surgical consent, pre-op evaluation, timeout performed, IV checked, risks and benefits discussed and monitors and equipment checked  Epidural Patient position: sitting Prep: site prepped and draped and DuraPrep Patient monitoring: continuous pulse ox and blood pressure Approach: midline Injection technique: LOR air  Needle:  Needle type: Tuohy  Needle gauge: 17 G Needle length: 9 cm and 9 Needle insertion depth: 8 cm Catheter type: closed end flexible Catheter size: 19 Gauge Catheter at skin depth: 13 cm Test dose: negative  Assessment Events: blood not aspirated, injection not painful, no injection resistance, negative IV test and no paresthesia  Additional Notes Discussed risk of headache, infection, bleeding, nerve injury and failed or incomplete block.  Patient voices understanding and wishes to proceed.  Epidural placed easily on first attempt.  Patient tolerated procedure well with no apparent complications.  Jasmine December, MD Reason for block:procedure for pain

## 2012-09-29 NOTE — Progress Notes (Signed)
Post Partum Day 0:S/P SVB, single stitch for hemostasis.  Anticipates tubal late this afternoon. Subjective: Patient up ad lib, denies syncope or dizziness. Feeding:  Breast Contraceptive plan:   BTL during hospitalization  Objective: Blood pressure 137/76, pulse 105, temperature 98.5 F (36.9 C), temperature source Oral, resp. rate 20, height 5\' 8"  (1.727 m), weight 236 lb (107.049 kg), last menstrual period 01/06/2012, SpO2 100.00%, unknown if currently breastfeeding.  Physical Exam:  General: alert Lochia: appropriate Uterine Fundus: firm Incision: healing well DVT Evaluation: No evidence of DVT seen on physical exam. Negative Homan's sign.   Basename 09/28/12 2323  HGB 12.0  HCT 37.2    Assessment/Plan: S/P Vaginal delivery day 0 Continue current care Await tubal later today--given OK for breakfast.   LOS: 1 day   Aayan Haskew 09/29/2012, 7:30 AM

## 2012-09-29 NOTE — Progress Notes (Signed)
,  Comfortable O BP 154/82  Pulse 102  Temp 98.3 F (36.8 C) (Oral)  Resp 18  Ht 5\' 8"  (1.727 m)  Wt 236 lb (107.049 kg)  BMI 35.88 kg/m2  SpO2 100%  LMP 01/06/2012      fhts category 1      abd soft between uc      Contractions irregular      Vag not assessed A [redacted]w[redacted]d     Probable inadequate UC is GBS + P continue care, Dr. Pennie Rushing updated on pt status. [name], CNM

## 2012-09-30 ENCOUNTER — Encounter (HOSPITAL_COMMUNITY): Payer: Self-pay

## 2012-09-30 ENCOUNTER — Encounter (HOSPITAL_COMMUNITY)
Admission: AD | Disposition: A | Discharge status: Home / Self Care | Payer: Self-pay | Source: Ambulatory Visit | Attending: Obstetrics and Gynecology

## 2012-09-30 ENCOUNTER — Inpatient Hospital Stay (HOSPITAL_COMMUNITY): Payer: BC Managed Care – PPO

## 2012-09-30 DIAGNOSIS — Z302 Encounter for sterilization: Secondary | ICD-10-CM

## 2012-09-30 HISTORY — PX: TUBAL LIGATION: SHX77

## 2012-09-30 LAB — CBC
HCT: 35 % — ABNORMAL LOW (ref 36.0–46.0)
Hemoglobin: 11 g/dL — ABNORMAL LOW (ref 12.0–15.0)
MCH: 24.1 pg — ABNORMAL LOW (ref 26.0–34.0)
MCV: 76.8 fL — ABNORMAL LOW (ref 78.0–100.0)
Platelets: 146 10*3/uL — ABNORMAL LOW (ref 150–400)
RBC: 4.56 MIL/uL (ref 3.87–5.11)

## 2012-09-30 SURGERY — LIGATION, FALLOPIAN TUBE, POSTPARTUM
Anesthesia: Epidural | Site: Abdomen | Laterality: Bilateral | Wound class: Clean Contaminated

## 2012-09-30 MED ORDER — METOCLOPRAMIDE HCL 10 MG PO TABS
10.0000 mg | ORAL_TABLET | Freq: Once | ORAL | Status: AC
  Administered 2012-09-30: 10 mg via ORAL
  Filled 2012-09-30: qty 1

## 2012-09-30 MED ORDER — BUPIVACAINE HCL (PF) 0.25 % IJ SOLN
INTRAMUSCULAR | Status: AC
  Filled 2012-09-30: qty 30

## 2012-09-30 MED ORDER — SODIUM BICARBONATE 8.4 % IV SOLN
INTRAVENOUS | Status: AC
  Filled 2012-09-30: qty 50

## 2012-09-30 MED ORDER — METOCLOPRAMIDE HCL 5 MG/ML IJ SOLN: 10.0000 mg | Freq: Once | INTRAMUSCULAR | Status: AC | PRN

## 2012-09-30 MED ORDER — LACTATED RINGERS IV SOLN
INTRAVENOUS | Status: DC
  Administered 2012-09-30: 125 mL/h via INTRAVENOUS
  Administered 2012-09-30 (×2): via INTRAVENOUS

## 2012-09-30 MED ORDER — LANOLIN HYDROUS EX OINT: TOPICAL_OINTMENT | CUTANEOUS | Status: DC | PRN

## 2012-09-30 MED ORDER — FAMOTIDINE 20 MG PO TABS
40.0000 mg | ORAL_TABLET | Freq: Once | ORAL | Status: AC
  Administered 2012-09-30: 40 mg via ORAL
  Filled 2012-09-30: qty 2

## 2012-09-30 MED ORDER — ONDANSETRON HCL 4 MG/2ML IJ SOLN
INTRAMUSCULAR | Status: DC | PRN
  Administered 2012-09-30: 4 mg via INTRAVENOUS

## 2012-09-30 MED ORDER — ONDANSETRON HCL 4 MG/2ML IJ SOLN
INTRAMUSCULAR | Status: AC
  Filled 2012-09-30: qty 2

## 2012-09-30 MED ORDER — MEPERIDINE HCL 25 MG/ML IJ SOLN: 6.2500 mg | INTRAMUSCULAR | Status: DC | PRN

## 2012-09-30 MED ORDER — FENTANYL CITRATE 0.05 MG/ML IJ SOLN
INTRAMUSCULAR | Status: AC
  Administered 2012-09-30: 50 ug via INTRAVENOUS
  Filled 2012-09-30: qty 2

## 2012-09-30 MED ORDER — SODIUM BICARBONATE 8.4 % IV SOLN
INTRAVENOUS | Status: DC | PRN
  Administered 2012-09-30 (×4): 5 mL via EPIDURAL

## 2012-09-30 MED ORDER — BUPIVACAINE HCL (PF) 0.25 % IJ SOLN
INTRAMUSCULAR | Status: DC | PRN
  Administered 2012-09-30: 15 mL
  Administered 2012-09-30: 5 mL

## 2012-09-30 MED ORDER — LIDOCAINE-EPINEPHRINE (PF) 2 %-1:200000 IJ SOLN
INTRAMUSCULAR | Status: AC
  Filled 2012-09-30: qty 20

## 2012-09-30 MED ORDER — FENTANYL CITRATE 0.05 MG/ML IJ SOLN
25.0000 ug | INTRAMUSCULAR | Status: DC | PRN
  Administered 2012-09-30 (×2): 50 ug via INTRAVENOUS

## 2012-09-30 SURGICAL SUPPLY — 23 items
CHLORAPREP W/TINT 26ML (MISCELLANEOUS) ×2 IMPLANT
CLOTH BEACON ORANGE TIMEOUT ST (SAFETY) ×2 IMPLANT
CONTAINER PREFILL 10% NBF 15ML (MISCELLANEOUS) ×4 IMPLANT
DERMABOND ADHESIVE PROPEN (GAUZE/BANDAGES/DRESSINGS) ×1
DERMABOND ADVANCED .7 DNX6 (GAUZE/BANDAGES/DRESSINGS) ×1 IMPLANT
DRSG COVADERM PLUS 2X2 (GAUZE/BANDAGES/DRESSINGS) ×2 IMPLANT
ELECT REM PT RETURN 9FT ADLT (ELECTROSURGICAL) ×2
ELECTRODE REM PT RTRN 9FT ADLT (ELECTROSURGICAL) ×1 IMPLANT
GLOVE BIOGEL PI IND STRL 7.0 (GLOVE) ×2 IMPLANT
GLOVE BIOGEL PI INDICATOR 7.0 (GLOVE) ×2
GLOVE ECLIPSE 6.5 STRL STRAW (GLOVE) ×2 IMPLANT
GOWN PREVENTION PLUS LG XLONG (DISPOSABLE) ×4 IMPLANT
NEEDLE HYPO 25X1 1.5 SAFETY (NEEDLE) ×2 IMPLANT
NS IRRIG 1000ML POUR BTL (IV SOLUTION) ×2 IMPLANT
PACK ABDOMINAL MINOR (CUSTOM PROCEDURE TRAY) ×2 IMPLANT
PENCIL BUTTON HOLSTER BLD 10FT (ELECTRODE) ×2 IMPLANT
SPONGE LAP 4X18 X RAY DECT (DISPOSABLE) ×2 IMPLANT
SUT MNCRL AB 4-0 PS2 18 (SUTURE) ×2 IMPLANT
SUT VICRYL 0 UR6 27IN ABS (SUTURE) ×2 IMPLANT
SYR CONTROL 10ML LL (SYRINGE) ×2 IMPLANT
TOWEL OR 17X24 6PK STRL BLUE (TOWEL DISPOSABLE) ×4 IMPLANT
TRAY FOLEY CATH 14FR (SET/KITS/TRAYS/PACK) ×2 IMPLANT
WATER STERILE IRR 1000ML POUR (IV SOLUTION) ×2 IMPLANT

## 2012-09-30 NOTE — Transfer of Care (Signed)
Immediate Anesthesia Transfer of Care Note  Patient: Pamela Hall  Procedure(s) Performed: Procedure(s) (LRB) with comments: POST PARTUM TUBAL LIGATION (Bilateral)  Patient Location: PACU  Anesthesia Type:Epidural  Level of Consciousness: sedated  Airway & Oxygen Therapy: Patient Spontanous Breathing  Post-op Assessment: Report given to PACU RN  Post vital signs: Reviewed and stable  Complications: No apparent anesthesia complications

## 2012-09-30 NOTE — Anesthesia Postprocedure Evaluation (Signed)
  Anesthesia Post-op Note  Patient: Pamela Hall  Procedure(s) Performed: Procedure(s) (LRB) with comments: POST PARTUM TUBAL LIGATION (Bilateral)  Patient Location: PACU  Anesthesia Type:Epidural  Level of Consciousness: awake, alert  and oriented  Airway and Oxygen Therapy: Patient Spontanous Breathing  Post-op Pain: none  Post-op Assessment: Post-op Vital signs reviewed, Patient's Cardiovascular Status Stable, Respiratory Function Stable, Patent Airway, No signs of Nausea or vomiting, Pain level controlled, No headache and No backache  Post-op Vital Signs: Reviewed and stable  Complications: No apparent anesthesia complications

## 2012-09-30 NOTE — Anesthesia Preprocedure Evaluation (Signed)
Anesthesia Evaluation  Patient identified by MRN, date of birth, ID band Patient awake    Reviewed: Allergy & Precautions, H&P , NPO status , Patient's Chart, lab work & pertinent test results, reviewed documented beta blocker date and time   History of Anesthesia Complications Negative for: history of anesthetic complications  Airway Mallampati: I TM Distance: >3 FB Neck ROM: full    Dental No notable dental hx. (+) Teeth Intact   Pulmonary neg pulmonary ROS,  breath sounds clear to auscultation  Pulmonary exam normal       Cardiovascular negative cardio ROS  Rhythm:regular Rate:Normal     Neuro/Psych negative neurological ROS  negative psych ROS   GI/Hepatic negative GI ROS, Neg liver ROS,   Endo/Other  negative endocrine ROS  Renal/GU negative Renal ROS  negative genitourinary   Musculoskeletal negative musculoskeletal ROS (+)   Abdominal Normal abdominal exam  (+)   Peds  Hematology negative hematology ROS (+)   Anesthesia Other Findings   Reproductive/Obstetrics negative OB ROS Desires Sterilization                           Anesthesia Physical  Anesthesia Plan  ASA: II  Anesthesia Plan: Epidural   Post-op Pain Management:    Induction:   Airway Management Planned:   Additional Equipment:   Intra-op Plan:   Post-operative Plan:   Informed Consent: I have reviewed the patients History and Physical, chart, labs and discussed the procedure including the risks, benefits and alternatives for the proposed anesthesia with the patient or authorized representative who has indicated his/her understanding and acceptance.   Dental advisory given  Plan Discussed with: CRNA, Anesthesiologist and Surgeon  Anesthesia Plan Comments:         Anesthesia Quick Evaluation

## 2012-09-30 NOTE — Anesthesia Postprocedure Evaluation (Signed)
Anesthesia Post Note  Patient: Pamela Hall  Procedure(s) Performed: Procedure(s) (LRB): POST PARTUM TUBAL LIGATION (Bilateral)  Anesthesia type: Epidural  Patient location: Mother/Baby  Post pain: Pain level controlled  Post assessment: Post-op Vital signs reviewed  Last Vitals:  Filed Vitals:   09/30/12 1700  BP: 121/74  Pulse: 69  Temp: 36.7 C  Resp: 20    Post vital signs: Reviewed  Level of consciousness:alert  Complications: No apparent anesthesia complications

## 2012-09-30 NOTE — Addendum Note (Signed)
Addendum  created 09/30/12 1838 by Algis Greenhouse, CRNA   Modules edited:Notes Section

## 2012-09-30 NOTE — Anesthesia Postprocedure Evaluation (Signed)
Patient stable following vaginal delivery.  

## 2012-09-30 NOTE — Op Note (Signed)
reop diagnosis: Multiparity desires sterilization  Postop diagnosis: Same  Procedure: Postpartum tubal ligation Surgeon: Lorenza Shakir Anesthesia:Epidural Estimated blood loss: Minimal Complications: None  Pathology: Bilateral portion of tubes Condition and transferred to PACU: stable   Before the tubal ligation was done, the patient was told that the risks are but not limited to bleeding infection damage to internal organs such as bowel bladder major blood vessels. The patient understands that the failure rate is 1 in 200. She understands a half of those failures can result in ectopic or tubal pregnancy. The patient was also well aware of all birth control that was available to her. The patient was taken to the operating room. Once her epidural was found to be adequate, a timeout was done. The patient's consent was signed.  20 cc of local was she used to infiltrate around the umbilicus to obtain adequate anesthesia. This amounts was used because the patient felt prickly touch with the Allis clamp. Once all the anesthesia was found to be adequate, I began the procedure. A 10 mm infraumbilical incision was made along her previous incision. The fascia was then grasped with 2 cokers after the subcutaneous tissue had been bluntly and sharply dissected. The fascia was then incised and extended bilaterally. The peritoneum was entered bluntly. The patient's right fallopian tube was grasped with Babcock clamp. Follow out to the fimbriated end. And a half a centimeter of the mid isthmic portion of the tube was ligated with 2-0 plain and excised. The patient's left fallopian tube was grasped with Babcock clamp followed out to the fimbriated end. About a centimeter of the mid isthmic portion of the tube was ligated with 2-0 plain and excised. Both tubes were returned to the abdomen. Hemostasis was noted. The peritoneum was closed with 0 Vicryl. The fascia was also closed using 0 Vicryl. The skin was closed with 3-0  Monocryl in subcuticular fashion. Sponge lap and needle counts were correct. Patient went to recovery in stable condition.  

## 2012-09-30 NOTE — Progress Notes (Signed)
Date of Initial H&P: 12/13  History reviewed, patient examined, no change in status, stable for surgery.

## 2012-10-01 ENCOUNTER — Encounter (HOSPITAL_COMMUNITY): Payer: Self-pay | Admitting: Obstetrics and Gynecology

## 2012-10-01 ENCOUNTER — Encounter: Payer: BC Managed Care – PPO | Admitting: Obstetrics and Gynecology

## 2012-10-01 MED ORDER — OXYCODONE-ACETAMINOPHEN 5-325 MG PO TABS: 1.0000 | ORAL_TABLET | ORAL | Status: DC | PRN

## 2012-10-01 MED ORDER — POLYSACCHARIDE IRON COMPLEX 150 MG PO CAPS: 150.0000 mg | ORAL_CAPSULE | Freq: Two times a day (BID) | ORAL | Status: DC

## 2012-10-01 MED ORDER — IBUPROFEN 600 MG PO TABS: 600.0000 mg | ORAL_TABLET | Freq: Four times a day (QID) | ORAL | Status: DC

## 2012-10-05 NOTE — Discharge Summary (Signed)
  Vaginal Delivery Discharge Summary  Pamela Hall  DOB:    03-Apr-1986 MRN:    [id number] CSN:    [id number]  Date of admission:                  09/28/2012  Date of discharge:                   10/01/2012  Procedures this admission:  Epidural, NSVD, PP BTL   Newborn Data:  Live born female  Birth Weight: 5 lb 14.4 oz (2676 g) APGAR: 9, 9  Home with mother.   History of Present Illness:  Pamela Hall is a 26 y.o. female, [id number], who presents at [redacted]w[redacted]d weeks gestation. The patient has been followed at the [facility] and Gynecology division of [facility]. She was admitted onset of labor. Her pregnancy has been complicated by: none.  Hospital course:  The patient was admitted for labor.   Her labor was not complicated. She proceeded to have a vaginal delivery of a healthy infant. Her delivery was not complicated. Her postpartum course was not complicated. She had a PP BTL on PPD 1.  She was discharged to home on postpartum day 2 doing well.  Feeding:  breast  Contraception:  rcv'd BTL  Discharge hemoglobin:  Hemoglobin  Date Value Range Status  09/30/2012 11.0* 12.0 - 15.0 g/dL Final     HCT  Date Value Range Status  09/30/2012 35.0* 36.0 - 46.0 % Final    Discharge Physical Exam:   General: no distress Lochia: appropriate Uterine Fundus: firm Incision: healing well DVT Evaluation: No evidence of DVT seen on physical exam.  Intrapartum Procedures: spontaneous vaginal delivery Postpartum Procedures: PPBTL Complications-Operative and Postpartum: none  Discharge Diagnoses: Term Pregnancy-delivered  Discharge Information:  Activity:           pelvic rest Diet:                routine Medications: PNV and Ibuprofen Condition:      stable Instructions:  refer to practice specific booklet Discharge to: home  Follow-up Information    Follow up with [facility]. In 6  weeks.   Contact information:   [address] [place] [address] [phone]          Malissa Hippo 10/05/2012

## 2012-11-10 ENCOUNTER — Encounter: Payer: Self-pay | Admitting: Obstetrics and Gynecology

## 2012-11-10 ENCOUNTER — Ambulatory Visit: Payer: BC Managed Care – PPO | Admitting: Obstetrics and Gynecology

## 2012-11-10 VITALS — BP 110/62 | Wt 215.0 lb

## 2012-11-10 DIAGNOSIS — Z124 Encounter for screening for malignant neoplasm of cervix: Secondary | ICD-10-CM

## 2012-11-10 NOTE — Progress Notes (Signed)
Pamela Hall  is 6 weeks postpartum following a spontaneous vaginal delivery at 38 gestational weeks Date: 09/29/12 female baby named Asten delivered by [name] CNM.  Breastfeeding: no Bottlefeeding:  yes  Post-partum blues / depression:  no  EPDS score: 5  History of abnormal Pap:  yes  Last Pap: Date: 10/16/2011 WNL Gestational diabetes:  no  Contraception:  Desires bilateral tubal ligation  Normal urinary function:  yes Normal GI function:  yes Returning to work: yes started last [employer]

## 2012-11-10 NOTE — Progress Notes (Signed)
Subjective:     Pamela Hall is a 27 y.o. female who presents for a postpartum visit.     General: doing well, bottle feeding  GI: normal bowel habits, no hemorrhoids GU: normal urinary function, vaginal bleeding has stopped,  Psychiatric: mood stable, no excessive stress at home, plans to return to work NO  I have fully reviewed the prenatal and intrapartum course.   Contraception:   Review of Systems Pertinent items are noted in HPI.   Objective:    BP 110/62  Wt 215 lb (97.523 kg)  Breastfeeding? No  General:  alert, cooperative and no distress     Lungs: clear to auscultation bilaterally  Heart:  regular rate and rhythm, S1, S2 normal, no murmur  Abdomen: soft, non-tender; bowel sounds normal; no masses,  no organomegaly, diastasis minimal     Vulva:  normal  Vagina: normal vagina, strong kegel, perineum healed   Cervix:  normal  Uterus : normal size, contour, position, consistency, mobility, non-tender  Adnexa:  normal adnexa             Assessment:     Normal postpartum exam.      Plan:  Pap smear done at today's visit. S/p BTL  RTO 71yr or PRN  rv'd healthy eating habits and regular exercise  Sanda Klein, CNM  11/10/2012 1:21 PM

## 2012-11-12 LAB — PAP IG W/ RFLX HPV ASCU

## 2012-12-26 ENCOUNTER — Encounter (HOSPITAL_COMMUNITY): Payer: Self-pay | Admitting: Emergency Medicine

## 2012-12-26 ENCOUNTER — Emergency Department (HOSPITAL_COMMUNITY)
Admission: EM | Admit: 2012-12-26 | Discharge: 2012-12-26 | Disposition: A | Discharge status: Home / Self Care | Payer: 59 | Source: Home / Self Care

## 2012-12-26 DIAGNOSIS — M65839 Other synovitis and tenosynovitis, unspecified forearm: Secondary | ICD-10-CM

## 2012-12-26 NOTE — ED Provider Notes (Signed)
Medical screening examination/treatment/procedure(s) were performed by non-physician practitioner and as supervising physician I was immediately available for consultation/collaboration.  Leslee Home, M.D.  Reuben Likes, MD 12/26/12 [phone]

## 2012-12-26 NOTE — ED Notes (Signed)
Left wrist pain onset 3 weeks ago, unknown injury.

## 2012-12-26 NOTE — ED Provider Notes (Signed)
History     CSN: [id number]  Arrival date & time 12/26/12  1003   First MD Initiated Contact with Patient 12/26/12 1007      No chief complaint on file.   (Consider location/radiation/quality/duration/timing/severity/associated sxs/prior treatment) HPI Comments: 27 year old female complaining of pain in the left wrist for 3 weeks. It is worse with lifting placing weight on the hand and other movements. 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 This all exacerbates her pain. There is no history of trauma or single injurious event. The pain is located along the extensor and radial aspect of the hand and wrist. The tenderness tracks along the extensor policus LONGUS.    Past Medical History  Diagnosis Date  . Abnormal Pap smear 2008    COLPO;WAS NORMAL;LAST PAP 10/2011  . Infection     YEAST INF;NOT FREQ  . Infection     BV;MAY GET FREQ  . Infection 2000    TRICH;WAS TREATED    Past Surgical History  Procedure Laterality Date  . Wisdom tooth extraction  2003    ALL 4 EXTRACTED  . Tubal ligation  09/30/2012    Procedure: POST PARTUM TUBAL LIGATION;  Surgeon: Michael Litter, MD;  Location: [facility];  Service: Gynecology;  Laterality: Bilateral;    Family History  Problem Relation Age of Onset  . Schizophrenia Mother   . Bipolar disorder Mother   . Heart disease Mother   . Heart attack Mother   . Cancer Maternal Grandmother   . Diabetes type II Maternal Grandmother   . Hypertension Maternal Grandmother   . Heart disease Maternal Grandmother   . Anesthesia problems Maternal Grandmother     ALLERGIC TO GENERAL ANESTHESIA  . Other Maternal Grandmother     History  Substance Use Topics  . Smoking status: Never Smoker   . Smokeless tobacco: Never Used  . Alcohol Use: 0.0 oz/week    1-2 Glasses of wine per week     Comment: SOCIAL DRINKER    OB History   Grav Para Term Preterm Abortions TAB SAB Ect Mult Living   3 2 2  1     2       Review  of Systems  Constitutional: Negative for fever, chills and activity change.  HENT: Negative.   Respiratory: Negative.   Cardiovascular: Negative.   Musculoskeletal:       As per HPI  Skin: Negative for color change, pallor and rash.  Neurological: Negative.     Allergies  Review of patient's allergies indicates no known allergies.  Home Medications   Current Outpatient Rx  Name  Route  Sig  Dispense  Refill  . calcium carbonate (TUMS - DOSED IN MG ELEMENTAL CALCIUM) 500 MG chewable tablet   Oral   Chew 2 tablets by mouth daily as needed. For heartburn         . chlorpheniramine-HYDROcodone (TUSSIONEX PENNKINETIC ER) 10-8 MG/5ML LQCR   Oral   Take 5 mLs by mouth every 12 (twelve) hours.   140 mL   0   . ibuprofen (ADVIL,MOTRIN) 600 MG tablet   Oral   Take 1 tablet (600 mg total) by mouth every 6 (six) hours.   30 tablet   2   . iron polysaccharides (NIFEREX) 150 MG capsule   Oral   Take 1 capsule (150 mg total) by mouth 2 (two) times daily.   60 capsule   2   . oxyCODONE-acetaminophen (PERCOCET/ROXICET) 5-325 MG  per tablet   Oral   Take 1-2 tablets by mouth every 4 (four) hours as needed (moderate - severe pain).   30 tablet   0   . pseudoephedrine (SUDAFED) 30 MG tablet   Oral   Take 30 mg by mouth every 4 (four) hours as needed.         . ranitidine (ZANTAC) 75 MG tablet   Oral   Take 75 mg by mouth 2 (two) times daily.           BP 115/78  Pulse 74  Temp(Src) 97 F (36.1 C) (Oral)  Resp 16  SpO2 98%  Physical Exam  Constitutional: She is oriented to person, place, and time. She appears well-developed and well-nourished. No distress.  HENT:  Head: Normocephalic and atraumatic.  Eyes: EOM are normal. Pupils are equal, round, and reactive to light.  Neck: Normal range of motion. Neck supple.  Musculoskeletal:  Normal range of motion. Tenderness along the extensor surface of the thumb radial aspect of the hand and wrist. Positive Finkelstein  sign. Strength is 5 over 5 in the thumb she is able to oppose the completely, make a fist. There is no tenderness in the other areas of the wrist, hand or digits. No discoloration or deformity. Distal neurovascular motor sensory is intact.  Lymphadenopathy:    She has no cervical adenopathy.  Neurological: She is alert and oriented to person, place, and time. No cranial nerve deficit.  Skin: Skin is warm and dry.    ED Course  Procedures (including critical care time)  Labs Reviewed - No data to display No results found.   1. Tendinitis of thumb       MDM  Apply thumb splint and where from one to 3 weeks as needed. May also apply ice to help with inflammation. Occasionally may take an NSAID such as ibuprofen or Aleve. Limit use of the thumb for the same period of time. May followup with your primary care doctor as needed.         Hayden Rasmussen, NP 12/26/12 1046

## 2012-12-26 NOTE — ED Notes (Signed)
Patient with questions related to performing job.m  [employer], np.  Onalee Hua np, addressed concerns with patient

## 2013-03-18 ENCOUNTER — Encounter: Payer: Self-pay | Admitting: Family

## 2013-03-18 ENCOUNTER — Ambulatory Visit (INDEPENDENT_AMBULATORY_CARE_PROVIDER_SITE_OTHER): Payer: 59 | Admitting: Family

## 2013-03-18 VITALS — BP 98/62 | HR 67 | Ht 67.0 in | Wt 219.0 lb

## 2013-03-18 DIAGNOSIS — Z0000 Encounter for general adult medical examination without abnormal findings: Secondary | ICD-10-CM

## 2013-03-18 LAB — COMPREHENSIVE METABOLIC PANEL
Albumin: 3.9 g/dL (ref 3.5–5.2)
Alkaline Phosphatase: 48 U/L (ref 39–117)
BUN: 10 mg/dL (ref 6–23)
Glucose, Bld: 88 mg/dL (ref 70–99)
Potassium: 4 mEq/L (ref 3.5–5.1)

## 2013-03-18 LAB — LIPID PANEL
Cholesterol: 182 mg/dL (ref 0–200)
HDL: 31.6 mg/dL — ABNORMAL LOW (ref >39.00)
Triglycerides: 82 mg/dL (ref 0.0–149.0)
VLDL: 16.4 mg/dL (ref 0.0–40.0)

## 2013-03-18 LAB — CBC WITH DIFFERENTIAL/PLATELET
Eosinophils Relative: 2.6 % (ref 0.0–5.0)
Lymphocytes Relative: 40.8 % (ref 12.0–46.0)
Monocytes Absolute: 0.6 10*3/uL (ref 0.1–1.0)
Monocytes Relative: 9.3 % (ref 3.0–12.0)
Neutrophils Relative %: 46.7 % (ref 43.0–77.0)
Platelets: 188 10*3/uL (ref 150.0–400.0)
WBC: 6.4 10*3/uL (ref 4.5–10.5)

## 2013-03-18 LAB — TSH: TSH: 0.6 u[IU]/mL (ref 0.35–5.50)

## 2013-03-18 NOTE — Patient Instructions (Addendum)

## 2013-03-18 NOTE — Progress Notes (Signed)
Subjective:    Patient ID: Pamela Hall, female    DOB: Aug 16, 1986, 27 y.o.   MRN: [id number]  HPI 27 year old [place] American female presents to PCP as a new patient for a annual physical. Pt reports no acute issues at this time. States she is followed my GYN for annual pelvic/PAP exams. Reports TDAP up to date.   Review of Systems  Constitutional: Negative.   HENT: Negative.   Eyes: Negative.   Respiratory: Negative.   Cardiovascular: Negative.   Gastrointestinal: Negative.   Endocrine: Negative.   Genitourinary: Negative.   Musculoskeletal: Negative.   Skin: Negative.   Allergic/Immunologic: Negative.   Neurological: Negative.   Hematological: Negative.   Psychiatric/Behavioral: Negative.    Past Medical History  Diagnosis Date  . Abnormal Pap smear 2008    COLPO;WAS NORMAL;LAST PAP 10/2011  . Infection     YEAST INF;NOT FREQ  . Infection     BV;MAY GET FREQ  . Infection 2000    TRICH;WAS TREATED    History   Social History  . Marital Status: Married    Spouse Name: Leeanne Rio    Number of Children: 1  . Years of Education: 17   Occupational History  . RN [employer]   Social History Main Topics  . Smoking status: Never Smoker   . Smokeless tobacco: Never Used  . Alcohol Use: No     Comment: SOCIAL DRINKER  . Drug Use: No  . Sexually Active: Not Currently -- Female partner(s)    Birth Control/ Protection: Surgical   Other Topics Concern  . Not on file   Social History Narrative  . No narrative on file    Past Surgical History  Procedure Laterality Date  . Wisdom tooth extraction  2003    ALL 4 EXTRACTED  . Tubal ligation  09/30/2012    Procedure: POST PARTUM TUBAL LIGATION;  Surgeon: Michael Litter, MD;  Location: [facility];  Service: Gynecology;  Laterality: Bilateral;    Family History  Problem Relation Age of Onset  . Schizophrenia Mother   . Bipolar disorder Mother   . Heart disease Mother   . Heart attack Mother   . Cancer  Maternal Grandmother   . Diabetes type II Maternal Grandmother   . Hypertension Maternal Grandmother   . Heart disease Maternal Grandmother   . Anesthesia problems Maternal Grandmother     ALLERGIC TO GENERAL ANESTHESIA  . Other Maternal Grandmother     No Known Allergies  Current Outpatient Prescriptions on File Prior to Visit  Medication Sig Dispense Refill  . calcium carbonate (TUMS - DOSED IN MG ELEMENTAL CALCIUM) 500 MG chewable tablet Chew 2 tablets by mouth daily as needed. For heartburn      . ibuprofen (ADVIL,MOTRIN) 600 MG tablet Take 1 tablet (600 mg total) by mouth every 6 (six) hours.  30 tablet  2  . oxyCODONE-acetaminophen (PERCOCET/ROXICET) 5-325 MG per tablet Take 1-2 tablets by mouth every 4 (four) hours as needed (moderate - severe pain).  30 tablet  0  . chlorpheniramine-HYDROcodone (TUSSIONEX PENNKINETIC ER) 10-8 MG/5ML LQCR Take 5 mLs by mouth every 12 (twelve) hours.  140 mL  0  . iron polysaccharides (NIFEREX) 150 MG capsule Take 1 capsule (150 mg total) by mouth 2 (two) times daily.  60 capsule  2  . pseudoephedrine (SUDAFED) 30 MG tablet Take 30 mg by mouth every 4 (four) hours as needed.      . ranitidine (ZANTAC) 75  MG tablet Take 75 mg by mouth 2 (two) times daily.       No current facility-administered medications on file prior to visit.    BP 98/62  Pulse 67  Ht 5\' 7"  (1.702 m)  Wt 219 lb (99.338 kg)  BMI 34.29 kg/m2  SpO2 98%chart    Objective:   Physical Exam  Constitutional: She is oriented to person, place, and time. She appears well-developed and well-nourished.  HENT:  Head: Normocephalic and atraumatic.  Right Ear: External ear normal.  Left Ear: External ear normal.  Nose: Nose normal.  Mouth/Throat: Oropharynx is clear and moist.  Eyes: Conjunctivae and EOM are normal. Pupils are equal, round, and reactive to light.  Neck: Normal range of motion. Neck supple. No thyromegaly present.  Cardiovascular: Normal rate, regular rhythm and  normal heart sounds.   Pulmonary/Chest: Effort normal and breath sounds normal.  Abdominal: Soft. Bowel sounds are normal.  Musculoskeletal: Normal range of motion.  Neurological: She is alert and oriented to person, place, and time. She has normal reflexes.  Skin: Skin is warm and dry.  Psychiatric: She has a normal mood and affect. Her behavior is normal.          Assessment & Plan:  1. Complete Physical Exam  Pt educated on monthly self breast exams and instructed to follow up with GYN for pelvic/PAP exam. Routine lab work ordered for physical exam.

## 2013-04-11 ENCOUNTER — Encounter (HOSPITAL_COMMUNITY): Payer: Self-pay | Admitting: *Deleted

## 2013-04-11 ENCOUNTER — Emergency Department (HOSPITAL_COMMUNITY): Payer: 59

## 2013-04-11 ENCOUNTER — Emergency Department (HOSPITAL_COMMUNITY)
Admission: EM | Admit: 2013-04-11 | Discharge: 2013-04-12 | Disposition: A | Discharge status: Home / Self Care | Payer: 59 | Attending: Emergency Medicine | Admitting: Emergency Medicine

## 2013-04-11 DIAGNOSIS — Z8619 Personal history of other infectious and parasitic diseases: Secondary | ICD-10-CM | POA: Insufficient documentation

## 2013-04-11 DIAGNOSIS — R0602 Shortness of breath: Secondary | ICD-10-CM | POA: Insufficient documentation

## 2013-04-11 DIAGNOSIS — R079 Chest pain, unspecified: Secondary | ICD-10-CM | POA: Insufficient documentation

## 2013-04-11 LAB — CBC
MCV: 81.9 fL (ref 78.0–100.0)
Platelets: 189 10*3/uL (ref 150–400)
RBC: 5.2 MIL/uL — ABNORMAL HIGH (ref 3.87–5.11)
WBC: 8.1 10*3/uL (ref 4.0–10.5)

## 2013-04-11 LAB — POCT I-STAT TROPONIN I: Troponin i, poc: 0 ng/mL (ref 0.00–0.08)

## 2013-04-11 LAB — BASIC METABOLIC PANEL
CO2: 24 mEq/L (ref 19–32)
Chloride: 102 mEq/L (ref 96–112)
Creatinine, Ser: 0.72 mg/dL (ref 0.50–1.10)
Sodium: 136 mEq/L (ref 135–145)

## 2013-04-11 MED ORDER — ASPIRIN 81 MG PO CHEW
324.0000 mg | CHEWABLE_TABLET | ORAL | Status: AC
  Administered 2013-04-11: 324 mg via ORAL
  Filled 2013-04-11: qty 4

## 2013-04-11 MED ORDER — NITROGLYCERIN 0.4 MG SL SUBL
0.4000 mg | SUBLINGUAL_TABLET | SUBLINGUAL | Status: DC | PRN
  Administered 2013-04-11 (×2): 0.4 mg via SUBLINGUAL

## 2013-04-11 NOTE — ED Notes (Signed)
Pt c/o intermittent substernal CP that radiates to back. Pain relived by nothing, exacerbated by nothing. Denies N/V, diaphoresis.

## 2013-04-11 NOTE — ED Notes (Signed)
Pt reports midsternal chest pain that radiates to the back btwn scapulae for past week. States chest pain has increased in severity today and came to the ER for further evaluation. 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 States she woke up with "crushing chest pain" this am. Pt states that pain increases with movement. Reports SOB with chest pain. Denies nausea, vomiting, dizziness, lightheadedness. Extensive cardiac family history. Mom's first MI was at the age of 82 and has had another MI since then.

## 2013-04-11 NOTE — ED Provider Notes (Signed)
History    CSN: [id number] Arrival date & time 04/11/13  2144  First MD Initiated Contact with Patient 04/11/13 2215     Chief Complaint  Patient presents with  . Chest Pain   (Consider location/radiation/quality/duration/timing/severity/associated sxs/prior Treatment) HPI Comments: Patient is a 27 year old female with no significant past medical history who presents for chest pain with onset 3 hours ago. Patient states she was in [employer] when she developed a central "crushing" chest pain that radiated through to her back x 15 minutes. Patient denied any aggravating or alleviating factors of her chest pain, though she admits to associated SOB. Patient denies associated fevers, vision changes, jaw pain, diaphoresis, N/V, abdominal pain, numbness/tingling, and extremity weakness. Patient is a Engineer, civil (consulting) in the ICU at [employer] and believes her symptoms may be the result of having to do chest compressions on a patient last night. Patient with no hx of CAD, HTN, HLD, and DM. FHx of MI x 2 in mother in 77's; mother with multiple comorbidities.  Patient is a 27 y.o. female presenting with chest pain. The history is provided by the patient. No language interpreter was used.  Chest Pain Associated symptoms: shortness of breath   Associated symptoms: no fever, no nausea, no numbness, not vomiting and no weakness    Past Medical History  Diagnosis Date  . Abnormal Pap smear 2008    COLPO;WAS NORMAL;LAST PAP 10/2011  . Infection     YEAST INF;NOT FREQ  . Infection     BV;MAY GET FREQ  . Infection 2000    TRICH;WAS TREATED   Past Surgical History  Procedure Laterality Date  . Wisdom tooth extraction  2003    ALL 4 EXTRACTED  . Tubal ligation  09/30/2012    Procedure: POST PARTUM TUBAL LIGATION;  Surgeon: Michael Litter, MD;  Location: [facility];  Service: Gynecology;  Laterality: Bilateral;   Family History  Problem Relation Age of Onset  . Schizophrenia Mother   . Bipolar disorder Mother   .  Heart disease Mother   . Heart attack Mother   . Cancer Maternal Grandmother   . Diabetes type II Maternal Grandmother   . Hypertension Maternal Grandmother   . Heart disease Maternal Grandmother   . Anesthesia problems Maternal Grandmother     ALLERGIC TO GENERAL ANESTHESIA  . Other Maternal Grandmother    History  Substance Use Topics  . Smoking status: Never Smoker   . Smokeless tobacco: Never Used  . Alcohol Use: No     Comment: SOCIAL DRINKER   OB History   Grav Para Term Preterm Abortions TAB SAB Ect Mult Living   3 2 2  1     2       Review of Systems  Constitutional: Negative for fever.  Eyes: Negative for visual disturbance.  Respiratory: Positive for shortness of breath.   Cardiovascular: Positive for chest pain.  Gastrointestinal: Negative for nausea and vomiting.  Neurological: Negative for weakness and numbness.  All other systems reviewed and are negative.   Allergies  Review of patient's allergies indicates no known allergies.  Home Medications   Current Outpatient Rx  Name  Route  Sig  Dispense  Refill  . acetaminophen (TYLENOL) 500 MG tablet   Oral   Take 1,000 mg by mouth daily as needed for pain.          BP 93/80  Pulse 82  Temp(Src) 98.4 F (36.9 C) (Oral)  Resp 16  SpO2 100%  LMP  03/18/2013  Physical Exam  Nursing note and vitals reviewed. Constitutional: She is oriented to person, place, and time. She appears well-developed and well-nourished. No distress.  HENT:  Head: Normocephalic and atraumatic.  Mouth/Throat: Oropharynx is clear and moist. No oropharyngeal exudate.  Eyes: Conjunctivae and EOM are normal. Pupils are equal, round, and reactive to light. No scleral icterus.  Neck: Normal range of motion. Neck supple.  Cardiovascular: Normal rate, regular rhythm, normal heart sounds and intact distal pulses.   Pulmonary/Chest: Effort normal and breath sounds normal. No respiratory distress. She has no wheezes. She has no rales.   Chest pain not reproducible on palpation of chest wall  Abdominal: Soft. Bowel sounds are normal. She exhibits no distension. There is no tenderness. There is no rebound.  Musculoskeletal: Normal range of motion. She exhibits no edema.  Lymphadenopathy:    She has no cervical adenopathy.  Neurological: She is alert and oriented to person, place, and time.  Skin: Skin is warm and dry. No rash noted. She is not diaphoretic. No erythema. No pallor.  Psychiatric: She has a normal mood and affect. Her behavior is normal.    ED Course  Procedures (including critical care time) Labs Reviewed  CBC - Abnormal; Notable for the following:    RBC 5.20 (*)    All other components within normal limits  BASIC METABOLIC PANEL - Abnormal; Notable for the following:    Potassium 3.3 (*)    All other components within normal limits  POCT I-STAT TROPONIN I  POCT I-STAT TROPONIN I   Dg Chest 2 View  04/11/2013   *RADIOLOGY REPORT*  Clinical Data: Shortness of breath  CHEST - 2 VIEW  Comparison: None.  Findings:  Normal cardiac silhouette and mediastinal contours.  No focal parenchymal opacities.  No pleural effusion or pneumothorax.  No evidence of edema.  No acute osseous abnormalities.  IMPRESSION: No acute cardiopulmonary disease.   Original Report Authenticated By: Tacey Ruiz, MD    Date: 04/12/2013  Rate: 85  Rhythm: normal sinus rhythm  QRS Axis: normal  Intervals: normal  ST/T Wave abnormalities: normal  Conduction Disutrbances:none  Narrative Interpretation: NSR without STEMI  Old EKG Reviewed: none available I have personally reviewed and interpreted this EKG   1. Chest pain     MDM  27 y/o female with no cardiac risk factors presents with central "crushing" chest pain x 15 minutes with associated SOB. Physical exam without significant findings; CP not reproducible on palpation. Patient given ASA and SL nitro x 1 in ED; she is well and nontoxic appearing and hemodynamically stable.  CBC and BMP unremarkable and first troponin 0.00. Chest x-ray without evidence of pneumonia, pleural effusion, pneumothorax, or other cardiopulmonary abnormality. Will continue to monitor patient. Second troponin ordered to be drawn at 00:45.  Doubt PE given lack of tachycardia, tachypnea, or hypoxia. Patient PERC negative. Anticipate d/c with cardiology follow up if 2nd troponin WNL.  Second troponin normal. Patient has remained well and hemodynamically stable without chest pain, tachycardia, tachypnea, or hypoxia. Appropriate for discharge with cardiology followup for further evaluation of symptoms as well as PCP followup as needed. Indications for ED return provided. Patient verbalizes comfort and understanding with plan with no unaddressed concerns.  Antony Madura, PA-C 04/12/13 [phone]

## 2013-04-12 LAB — POCT I-STAT TROPONIN I: Troponin i, poc: 0.01 ng/mL (ref 0.00–0.08)

## 2013-04-12 MED ORDER — POTASSIUM CHLORIDE CRYS ER 20 MEQ PO TBCR
40.0000 meq | EXTENDED_RELEASE_TABLET | Freq: Once | ORAL | Status: AC
  Administered 2013-04-12: 40 meq via ORAL
  Filled 2013-04-12: qty 2

## 2013-04-12 NOTE — ED Provider Notes (Signed)
Medical screening examination/treatment/procedure(s) were performed by non-physician practitioner and as supervising physician I was immediately available for consultation/collaboration.   Ocia Simek, MD 04/12/13 1417 

## 2013-05-05 ENCOUNTER — Ambulatory Visit (INDEPENDENT_AMBULATORY_CARE_PROVIDER_SITE_OTHER): Payer: 59 | Admitting: Family

## 2013-05-05 ENCOUNTER — Encounter: Payer: Self-pay | Admitting: Family

## 2013-05-05 VITALS — BP 112/72 | HR 68 | Wt 221.0 lb

## 2013-05-05 DIAGNOSIS — R079 Chest pain, unspecified: Secondary | ICD-10-CM

## 2013-05-05 MED ORDER — METHYLPREDNISOLONE 4 MG PO KIT: PACK | ORAL | Status: AC

## 2013-05-05 NOTE — Patient Instructions (Addendum)
Muscle Strain  Muscle strain occurs when a muscle is stretched beyond its normal length. A small number of muscle fibers generally are torn. This is especially common in athletes. This happens when a sudden, violent force placed on a muscle stretches it too far. Usually, recovery from muscle strain takes 1 to 2 weeks. Complete healing will take 5 to 6 weeks.   HOME CARE INSTRUCTIONS    While awake, apply ice to the sore muscle for the first 2 days after the injury.   Put ice in a plastic bag.   Place a towel between your skin and the bag.   Leave the ice on for 15-20 minutes each hour.   Do not use the strained muscle for several days, until you no longer have pain.   You may wrap the injured area with an elastic bandage for comfort. Be careful not to wrap it too tightly. This may interfere with blood circulation or increase swelling.   Only take over-the-counter or prescription medicines for pain, discomfort, or fever as directed by your caregiver.  SEEK MEDICAL CARE IF:   You have increasing pain or swelling in the injured area.  MAKE SURE YOU:    Understand these instructions.   Will watch your condition.   Will get help right away if you are not doing well or get worse.  Document Released: 10/01/2005 Document Revised: 12/24/2011 Document Reviewed: 10/13/2011  ExitCare Patient Information 2014 [employer].

## 2013-05-05 NOTE — Progress Notes (Signed)
Subjective:    Patient ID: Pamela Hall, female    DOB: 11/17/1985, 27 y.o.   MRN: [id number]  HPI  Pt is a 27 year old African American female who presents to PCP with mid sternal chest pain x 3 weeks. Pt seen in ED for chest pain on 04/11/13, since then pain has been reoccurring at irregular intervals. Currently pain is rated as 5/10 and described as "soreness". States pain is worsened with movement such as raising arms above head. Currently denies any associated symptoms of SOB, palpitations or dizziness. Denies any relieving factors and states symptoms typically subside on their own. Pt also discussed increase stress stemming from working 2 jobs and being in school.   Review of Systems  Constitutional: Negative.   HENT: Negative.   Eyes: Negative.   Respiratory: Negative.   Cardiovascular: Positive for chest pain. Negative for palpitations and leg swelling.  Gastrointestinal: Negative.   Endocrine: Negative.   Genitourinary: Negative.   Musculoskeletal: Negative.   Skin: Negative.   Allergic/Immunologic: Negative.   Neurological: Negative.   Hematological: Negative.   Psychiatric/Behavioral: Negative.    Past Medical History  Diagnosis Date  . Abnormal Pap smear 2008    COLPO;WAS NORMAL;LAST PAP 10/2011  . Infection     YEAST INF;NOT FREQ  . Infection     BV;MAY GET FREQ  . Infection 2000    TRICH;WAS TREATED    History   Social History  . Marital Status: Married    Spouse Name: Leeanne Rio    Number of Children: 1  . Years of Education: 17   Occupational History  . RN [employer]   Social History Main Topics  . Smoking status: Never Smoker   . Smokeless tobacco: Never Used  . Alcohol Use: No     Comment: SOCIAL DRINKER  . Drug Use: No  . Sexually Active: Not Currently -- Female partner(s)    Birth Control/ Protection: Surgical   Other Topics Concern  . Not on file   Social History Narrative  . No narrative on file    Past Surgical History   Procedure Laterality Date  . Wisdom tooth extraction  2003    ALL 4 EXTRACTED  . Tubal ligation  09/30/2012    Procedure: POST PARTUM TUBAL LIGATION;  Surgeon: Michael Litter, MD;  Location: [facility];  Service: Gynecology;  Laterality: Bilateral;    Family History  Problem Relation Age of Onset  . Schizophrenia Mother   . Bipolar disorder Mother   . Heart disease Mother   . Heart attack Mother   . Cancer Maternal Grandmother   . Diabetes type II Maternal Grandmother   . Hypertension Maternal Grandmother   . Heart disease Maternal Grandmother   . Anesthesia problems Maternal Grandmother     ALLERGIC TO GENERAL ANESTHESIA  . Other Maternal Grandmother     No Known Allergies  Current Outpatient Prescriptions on File Prior to Visit  Medication Sig Dispense Refill  . acetaminophen (TYLENOL) 500 MG tablet Take 1,000 mg by mouth daily as needed for pain.       No current facility-administered medications on file prior to visit.    BP 112/72  Pulse 68  Wt 221 lb (100.245 kg)  BMI 34.61 kg/m2  SpO2 98%  LMP 06/04/2014chart    Objective:   Physical Exam  Constitutional: She is oriented to person, place, and time. She appears well-developed and well-nourished.  HENT:  Head: Normocephalic and atraumatic.  Eyes: Pupils are  equal, round, and reactive to light.  Neck: Normal range of motion. Neck supple.  Cardiovascular: Normal rate and regular rhythm.   Pulmonary/Chest: Effort normal and breath sounds normal.  Abdominal: Soft. Bowel sounds are normal.  Musculoskeletal: Normal range of motion.  Tenderness with palpation to mid sternal chest  Neurological: She is alert and oriented to person, place, and time.  Skin: Skin is warm and dry.          Assessment & Plan:  1. Chest pain  Cardiac enzymes, blood work and other diagnostic procedures from ED reviewed. D-Dimer ordered to evaluate causative factor of chest pain. Pt prescribed Medrol pack. Pt instructed to cut back on  work load to help alleviate stress. Pt to follow up with PCP if symptoms worsen or persist or with any questions/concerns.

## 2013-05-06 LAB — D-DIMER, QUANTITATIVE: D-Dimer, Quant: 0.27 ug/mL-FEU (ref 0.00–0.48)

## 2013-08-20 ENCOUNTER — Other Ambulatory Visit: Payer: Self-pay

## 2013-09-14 ENCOUNTER — Emergency Department (HOSPITAL_COMMUNITY)
Admission: EM | Admit: 2013-09-14 | Discharge: 2013-09-14 | Disposition: A | Discharge status: Home / Self Care | Payer: 59 | Source: Home / Self Care | Attending: Emergency Medicine | Admitting: Emergency Medicine

## 2013-09-14 ENCOUNTER — Other Ambulatory Visit (HOSPITAL_COMMUNITY)
Admission: RE | Admit: 2013-09-14 | Discharge: 2013-09-14 | Disposition: A | Discharge status: Home / Self Care | Payer: 59 | Source: Ambulatory Visit | Attending: Emergency Medicine | Admitting: Emergency Medicine

## 2013-09-14 ENCOUNTER — Encounter (HOSPITAL_COMMUNITY): Payer: Self-pay | Admitting: Emergency Medicine

## 2013-09-14 DIAGNOSIS — N760 Acute vaginitis: Secondary | ICD-10-CM | POA: Insufficient documentation

## 2013-09-14 DIAGNOSIS — A499 Bacterial infection, unspecified: Secondary | ICD-10-CM

## 2013-09-14 DIAGNOSIS — B9689 Other specified bacterial agents as the cause of diseases classified elsewhere: Secondary | ICD-10-CM

## 2013-09-14 DIAGNOSIS — Z113 Encounter for screening for infections with a predominantly sexual mode of transmission: Secondary | ICD-10-CM | POA: Insufficient documentation

## 2013-09-14 MED ORDER — METRONIDAZOLE 500 MG PO TABS: 500.0000 mg | ORAL_TABLET | Freq: Two times a day (BID) | ORAL | Status: DC

## 2013-09-14 MED ORDER — FLUCONAZOLE 150 MG PO TABS: 150.0000 mg | ORAL_TABLET | Freq: Every day | ORAL | Status: DC

## 2013-09-14 NOTE — ED Notes (Signed)
C/o bv, reports history of the same.  Reports vaginal discharge, odor and itching for one week.

## 2013-09-14 NOTE — ED Provider Notes (Signed)
Medical screening examination/treatment/procedure(s) were performed by non-physician practitioner and as supervising physician I was immediately available for consultation/collaboration.  Leslee Home, M.D.  Reuben Likes, MD 09/14/13 [phone]

## 2013-09-14 NOTE — ED Provider Notes (Signed)
CSN: [id number]     Arrival date & time 09/14/13  1028 History   First MD Initiated Contact with Patient 09/14/13 1049     Chief Complaint  Patient presents with  . Vaginal Discharge   (Consider location/radiation/quality/duration/timing/severity/associated sxs/prior Treatment) Patient is a 27 y.o. female presenting with vaginal discharge. The history is provided by the patient. No language interpreter was used.  Vaginal Discharge Quality:  Watery Onset quality:  Gradual Duration:  1 week Timing:  Constant Progression:  Worsening Relieved by:  Nothing Ineffective treatments:  None tried Associated symptoms: no abdominal pain     Past Medical History  Diagnosis Date  . Abnormal Pap smear 2008    COLPO;WAS NORMAL;LAST PAP 10/2011  . Infection     YEAST INF;NOT FREQ  . Infection     BV;MAY GET FREQ  . Infection 2000    TRICH;WAS TREATED   Past Surgical History  Procedure Laterality Date  . Wisdom tooth extraction  2003    ALL 4 EXTRACTED  . Tubal ligation  09/30/2012    Procedure: POST PARTUM TUBAL LIGATION;  Surgeon: Michael Litter, MD;  Location: [facility];  Service: Gynecology;  Laterality: Bilateral;   Family History  Problem Relation Age of Onset  . Schizophrenia Mother   . Bipolar disorder Mother   . Heart disease Mother   . Heart attack Mother   . Cancer Maternal Grandmother   . Diabetes type II Maternal Grandmother   . Hypertension Maternal Grandmother   . Heart disease Maternal Grandmother   . Anesthesia problems Maternal Grandmother     ALLERGIC TO GENERAL ANESTHESIA  . Other Maternal Grandmother    History  Substance Use Topics  . Smoking status: Never Smoker   . Smokeless tobacco: Never Used  . Alcohol Use: No     Comment: SOCIAL DRINKER   OB History   Grav Para Term Preterm Abortions TAB SAB Ect Mult Living   3 2 2  1     2      Review of Systems  Gastrointestinal: Negative for abdominal pain.  Genitourinary: Positive for vaginal discharge.     Allergies  Review of patient's allergies indicates no known allergies.  Home Medications   Current Outpatient Rx  Name  Route  Sig  Dispense  Refill  . Miconazole Nitrate (MONISTAT 7 VA)   Vaginal   Place vaginally.         Marland Kitchen acetaminophen (TYLENOL) 500 MG tablet   Oral   Take 1,000 mg by mouth daily as needed for pain.          BP 119/77  Pulse 77  Temp(Src) 98.4 F (36.9 C) (Oral)  Resp 16  SpO2 97%  LMP 09/03/2013 Physical Exam  Nursing note and vitals reviewed. Constitutional: She appears well-developed and well-nourished.  HENT:  Head: Normocephalic.  Cardiovascular: Normal rate.   Pulmonary/Chest: Effort normal.  Abdominal: Soft.  Genitourinary: Vaginal discharge found.  Neurological: She is alert.  Skin: Skin is warm.  Psychiatric: She has a normal mood and affect.    ED Course  Procedures (including critical care time) Labs Review Labs Reviewed  CERVICOVAGINAL ANCILLARY ONLY   Imaging Review No results found.  EKG Interpretation    Date/Time:    Ventricular Rate:    PR Interval:    QRS Duration:   QT Interval:    QTC Calculation:   R Axis:     Text Interpretation:  MDM   1. BV (bacterial vaginosis)    Flagyl and diflucan given    Elson Areas, PA-C 09/14/13 [address] [place], [place] 09/14/13 1129

## 2013-09-14 NOTE — ED Notes (Signed)
Pelvic equipment at bedside 

## 2013-09-16 NOTE — ED Notes (Signed)
GC/Chlamydia neg., Affirm: Candida and Trich neg., Gardnerella pos.  Pt. Adequately treated with Flagyl. Vassie Moselle 09/16/2013

## 2014-01-13 ENCOUNTER — Telehealth: Payer: Self-pay

## 2014-01-13 NOTE — Telephone Encounter (Signed)
Left message to advise pt physical form is ready for pick up. Advised to call if she would like it faxed

## 2014-01-20 ENCOUNTER — Ambulatory Visit (INDEPENDENT_AMBULATORY_CARE_PROVIDER_SITE_OTHER): Payer: 59 | Admitting: Family

## 2014-01-20 ENCOUNTER — Encounter: Payer: Self-pay | Admitting: Family

## 2014-01-20 VITALS — BP 110/80 | HR 88 | Wt 228.0 lb

## 2014-01-20 DIAGNOSIS — F411 Generalized anxiety disorder: Secondary | ICD-10-CM

## 2014-01-20 MED ORDER — DIAZEPAM 5 MG PO TABS: 5.0000 mg | ORAL_TABLET | Freq: Two times a day (BID) | ORAL | Status: DC | PRN

## 2014-01-20 NOTE — Progress Notes (Signed)
Subjective:    Patient ID: Pamela Hall, female    DOB: 1986/04/04, 28 y.o.   MRN: [id number]  HPI 28 year old [place] American female, nonsmoker is in today with complaints of anxiety that typically occurs once a week. Has never taken any medication for anxiety. Denies any helplessness or hopelessness. Has never had therapy.   Review of Systems  Respiratory: Negative.   Cardiovascular: Negative.   Musculoskeletal: Negative.   Skin: Negative.   Allergic/Immunologic: Negative.   Neurological: Negative.   Psychiatric/Behavioral: The patient is nervous/anxious.    Past Medical History  Diagnosis Date  . Abnormal Pap smear 2008    COLPO;WAS NORMAL;LAST PAP 10/2011  . Infection     YEAST INF;NOT FREQ  . Infection     BV;MAY GET FREQ  . Infection 2000    TRICH;WAS TREATED    History   Social History  . Marital Status: Married    Spouse Name: Leeanne Rio    Number of Children: 1  . Years of Education: 17   Occupational History  . RN [employer]   Social History Main Topics  . Smoking status: Never Smoker   . Smokeless tobacco: Never Used  . Alcohol Use: No     Comment: SOCIAL DRINKER  . Drug Use: No  . Sexual Activity: Not Currently    Partners: Male    Birth Control/ Protection: Surgical   Other Topics Concern  . Not on file   Social History Narrative  . No narrative on file    Past Surgical History  Procedure Laterality Date  . Wisdom tooth extraction  2003    ALL 4 EXTRACTED  . Tubal ligation  09/30/2012    Procedure: POST PARTUM TUBAL LIGATION;  Surgeon: Michael Litter, MD;  Location: [facility];  Service: Gynecology;  Laterality: Bilateral;    Family History  Problem Relation Age of Onset  . Schizophrenia Mother   . Bipolar disorder Mother   . Heart disease Mother   . Heart attack Mother   . Cancer Maternal Grandmother   . Diabetes type II Maternal Grandmother   . Hypertension Maternal Grandmother   . Heart disease Maternal Grandmother    . Anesthesia problems Maternal Grandmother     ALLERGIC TO GENERAL ANESTHESIA  . Other Maternal Grandmother     No Known Allergies  Current Outpatient Prescriptions on File Prior to Visit  Medication Sig Dispense Refill  . acetaminophen (TYLENOL) 500 MG tablet Take 1,000 mg by mouth daily as needed for pain.       No current facility-administered medications on file prior to visit.    BP 110/80  Pulse 88  Wt 228 lb (103.42 kg)chart    Objective:   Physical Exam  Constitutional: She is oriented to person, place, and time. She appears well-developed and well-nourished.  Cardiovascular: Normal rate, regular rhythm and normal heart sounds.   Pulmonary/Chest: Effort normal and breath sounds normal.  Neurological: She is alert and oriented to person, place, and time.  Skin: Skin is warm and dry.  Psychiatric: She has a normal mood and affect.          Assessment & Plan:  Pamela Hall was seen today for anxiety.  Diagnoses and associated orders for this visit:  Anxiety state, unspecified  Other Orders - diazepam (VALIUM) 5 MG tablet; Take 1 tablet (5 mg total) by mouth every 12 (twelve) hours as needed for anxiety.    Advise psychotherapy with Judithe Modest. Valium as needed once  a week or so. Consider SSRI and anxiety increases more than once to twice a week. Recheck in 4 weeks

## 2014-01-20 NOTE — Patient Instructions (Signed)

## 2014-01-21 ENCOUNTER — Telehealth: Payer: Self-pay | Admitting: Family

## 2014-01-21 NOTE — Telephone Encounter (Signed)
Pt would like to you to add to her return to work note, that she is "medically cleared to work with no restrictions.

## 2014-01-22 NOTE — Telephone Encounter (Signed)
Note completed 

## 2014-01-22 NOTE — Telephone Encounter (Signed)
Note faxed to [name]

## 2014-02-17 ENCOUNTER — Ambulatory Visit: Payer: 59 | Admitting: Family

## 2014-02-17 DIAGNOSIS — Z0289 Encounter for other administrative examinations: Secondary | ICD-10-CM

## 2014-08-16 ENCOUNTER — Encounter: Payer: Self-pay | Admitting: Family

## 2017-04-22 ENCOUNTER — Telehealth: Payer: Self-pay | Admitting: Nurse Practitioner

## 2017-04-22 ENCOUNTER — Ambulatory Visit (INDEPENDENT_AMBULATORY_CARE_PROVIDER_SITE_OTHER): Payer: Self-pay | Admitting: Nurse Practitioner

## 2017-04-22 VITALS — BP 136/82 | HR 85 | Temp 99.0°F | Wt 244.4 lb

## 2017-04-22 DIAGNOSIS — B029 Zoster without complications: Secondary | ICD-10-CM

## 2017-04-22 MED ORDER — VALACYCLOVIR HCL 500 MG PO TABS: 500.0000 mg | ORAL_TABLET | Freq: Two times a day (BID) | ORAL | 0 refills | Status: AC

## 2017-04-22 NOTE — Patient Instructions (Addendum)
Shingles Shingles, which is also known as herpes zoster, is an infection that causes a painful skin rash and fluid-filled blisters. Shingles is not related to genital herpes, which is a sexually transmitted infection. Shingles only develops in people who:  Have had chickenpox.  Have received the chickenpox vaccine. (This is rare.)  What are the causes? Shingles is caused by varicella-zoster virus (VZV). This is the same virus that causes chickenpox. After exposure to VZV, the virus stays in the body in an inactive (dormant) state. Shingles develops if the virus reactivates. This can happen many years after the initial exposure to VZV. It is not known what causes this virus to reactivate. What increases the risk? People who have had chickenpox or received the chickenpox vaccine are at risk for shingles. Infection is more common in people who:  Are older than age 50.  Have a weakened defense (immune) system, such as those with HIV, AIDS, or cancer.  Are taking medicines that weaken the immune system, such as transplant medicines.  Are under great stress.  What are the signs or symptoms? Early symptoms of this condition include itching, tingling, and pain in an area on your skin. Pain may be described as burning, stabbing, or throbbing. A few days or weeks after symptoms start, a painful red rash appears, usually on one side of the body in a bandlike or beltlike pattern. The rash eventually turns into fluid-filled blisters that break open, scab over, and dry up in about 2-3 weeks. At any time during the infection, you may also develop:  A fever.  Chills.  A headache.  An upset stomach.  How is this diagnosed? This condition is diagnosed with a skin exam. Sometimes, skin or fluid samples are taken from the blisters before a diagnosis is made. These samples are examined under a microscope or sent to a lab for testing. How is this treated? There is no specific cure for this condition.  Your health care provider will probably prescribe medicines to help you manage pain, recover more quickly, and avoid long-term problems. Medicines may include:  Antiviral drugs.  Anti-inflammatory drugs.  Pain medicines.  If the area involved is on your face, you may be referred to a specialist, such as an eye doctor (ophthalmologist) or an ear, nose, and throat (ENT) doctor to help you avoid eye problems, chronic pain, or disability. Follow these instructions at home: Medicines  Take medicines only as directed by your health care provider.  Apply an anti-itch or numbing cream to the affected area as directed by your health care provider. Blister and Rash Care  Take a cool bath or apply cool compresses to the area of the rash or blisters as directed by your health care provider. This may help with pain and itching.  Keep your rash covered with a loose bandage (dressing). Wear loose-fitting clothing to help ease the pain of material rubbing against the rash.  Keep your rash and blisters clean with mild soap and cool water or as directed by your health care provider.  Check your rash every day for signs of infection. These include redness, swelling, and pain that lasts or increases.  Do not pick your blisters.  Do not scratch your rash. General instructions  Rest as directed by your health care provider.  Keep all follow-up visits as directed by your health care provider. This is important.  Until your blisters scab over, your infection can cause chickenpox in people who have never had it or been vaccinated   against it. To prevent this from happening, avoid contact with other people, especially: ? Babies. ? Pregnant women. ? Children who have eczema. ? Elderly people who have transplants. ? People who have chronic illnesses, such as leukemia or AIDS. Contact a health care provider if:  Your pain is not relieved with prescribed medicines.  Your pain does not get better after  the rash heals.  Your rash looks infected. Signs of infection include redness, swelling, and pain that lasts or increases. Get help right away if:  The rash is on your face or nose.  You have facial pain, pain around your eye area, or loss of feeling on one side of your face.  You have ear pain or you have ringing in your ear.  You have loss of taste.  Your condition gets worse. This information is not intended to replace advice given to you by your health care provider. Make sure you discuss any questions you have with your health care provider. Document Released: 10/01/2005 Document Revised: 05/27/2016 Document Reviewed: 08/12/2014 Elsevier Interactive Patient Education  2017 Elsevier Inc.  

## 2017-04-22 NOTE — Telephone Encounter (Signed)
Called pharmacy to provide order clarification.  Spoke with Bur[place]Dolly, pharmacist.  Telephone order for Valtrex 2gm q 12 hours on day one, then 500mg  daily for 6 days.

## 2017-04-22 NOTE — Telephone Encounter (Signed)
Patient called stating pharmacy needs clarification for medication order.  Informed patient that I will contact the pharmacy directly.

## 2017-04-22 NOTE — Progress Notes (Signed)
Subjective:     Pamela Hall is a 31 y.o. female who presents for evaluation of a rash involving the left neck. Rash started 3 days ago. Lesions are clear, and and blistering in texture. Patient does c/o fatigue and states she used Aloe Vera without any relief.  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 Rash has changed over time. Rash is painful. Associated symptoms: cervical lymphadenopathy. Patient denies: cough, fever and nausea. Patient has not had contacts with similar rash. Patient has not had new exposures (soaps, lotions, laundry detergents, foods, medications, plants, insects or animals).  Patient has a history of herpes labialis.   The following portions of the patient's history were reviewed and updated as appropriate: allergies, current medications and past medical history.  Review of Systems Constitutional: positive for fatigue Eyes: negative Ears, nose, mouth, throat, and face: negative Respiratory: negative Integument/breast: positive for skin color change, skin lesion(s) and left neck    Objective:    BP 136/82 (BP Location: Right Arm, Patient Position: Sitting, Cuff Size: Normal)   Pulse 85   Temp 99 F (37.2 C) (Oral)   Wt 244 lb 6.4 oz (110.9 kg)   SpO2 98%   BMI 38.28 kg/m  General:  alert, cooperative and no distress  Skin:  vesicles noted on left neck and no drainage, + cervical lymphadenopathy with mild erythema to left neck     Assessment:    Shingles    Plan:    Medications: Valtrex. Patient education provided.  Patient to avoid others until crusting or scabbing.  Do not rub or touch rash.  Strict handwashing.  Go to ER if increased neck swelling, difficulty breathing or other concerns.

## 2017-04-25 ENCOUNTER — Telehealth: Payer: Self-pay

## 2017-06-21 ENCOUNTER — Encounter: Payer: Self-pay | Admitting: Adult Health

## 2017-06-21 ENCOUNTER — Ambulatory Visit (INDEPENDENT_AMBULATORY_CARE_PROVIDER_SITE_OTHER): Payer: 59 | Admitting: Adult Health

## 2017-06-21 VITALS — BP 110/60 | Temp 98.4°F | Ht 66.25 in | Wt 240.0 lb

## 2017-06-21 DIAGNOSIS — E7801 Familial hypercholesterolemia: Secondary | ICD-10-CM | POA: Diagnosis not present

## 2017-06-21 DIAGNOSIS — Z7689 Persons encountering health services in other specified circumstances: Secondary | ICD-10-CM

## 2017-06-21 MED ORDER — VALACYCLOVIR HCL 500 MG PO TABS: 500.0000 mg | ORAL_TABLET | ORAL | 3 refills | Status: DC | PRN

## 2017-06-21 MED ORDER — EZETIMIBE 10 MG PO TABS: 10.0000 mg | ORAL_TABLET | Freq: Every day | ORAL | 3 refills | Status: DC

## 2017-06-21 NOTE — Progress Notes (Signed)
Patient presents to clinic today to establish care. She is a pleasant 31 year old female who  has a past medical history of Abnormal Pap smear (2008) and High cholesterol.   Her last physical was greater than one year ago.   Acute Concerns: Establish Care   Chronic Issues: Familial hypercholesterolemia- she takes Zetia   Health Maintenance: Dental -- Routine  Vision -- Not routine care  Immunizations -- UTD  Mammogram -- PAP -- Jan 2018  Diet: walks daily for at least 20 min Exercise:   Is followed by   [facility]    Past Medical History:  Diagnosis Date  . Abnormal Pap smear 2008   COLPO;WAS NORMAL;LAST PAP 10/2011  . High cholesterol     Past Surgical History:  Procedure Laterality Date  . TUBAL LIGATION  09/30/2012   Procedure: POST PARTUM TUBAL LIGATION;  Surgeon: Michael Litter, MD;  Location: [facility];  Service: Gynecology;  Laterality: Bilateral;  . WISDOM TOOTH EXTRACTION  2003   ALL 4 EXTRACTED    No current outpatient prescriptions on file prior to visit.   No current facility-administered medications on file prior to visit.     No Known Allergies  Family History  Problem Relation Age of Onset  . Diabetes type II Maternal Grandmother   . Hypertension Maternal Grandmother   . Heart disease Maternal Grandmother   . Anesthesia problems Maternal Grandmother        ALLERGIC TO GENERAL ANESTHESIA  . Other Maternal Grandmother   . Breast cancer Maternal Grandmother   . Schizophrenia Mother   . Bipolar disorder Mother   . Heart disease Mother   . Heart attack Mother     Social History   Social History  . Marital status: Married    Spouse name: Leeanne Rio  . Number of children: 1  . Years of education: 60   Occupational History  . NP  [employer]   Social History Main Topics  . Smoking status: Never Smoker  . Smokeless tobacco: Never Used  . Alcohol use No     Comment: SOCIAL DRINKER  . Drug use: No  . Sexual activity:  Not Currently    Partners: Male    Birth control/ protection: Surgical   Other Topics Concern  . Not on file   Social History Narrative  . No narrative on file    Review of Systems  Constitutional: Negative.   HENT: Negative.   Eyes: Negative.   Respiratory: Negative.   Cardiovascular: Negative.   Gastrointestinal: Negative.   Genitourinary: Negative.   Musculoskeletal: Negative.   Skin: Negative.   Neurological: Negative.   Endo/Heme/Allergies: Negative.   Psychiatric/Behavioral: Negative.   All other systems reviewed and are negative.   BP 110/60 (BP Location: Right Arm)   Temp 98.4 F (36.9 C) (Oral)   Ht 5' 6.25" (1.683 m)   Wt 240 lb (108.9 kg)   LMP 06/16/2017   BMI 38.45 kg/m   Physical Exam  Constitutional: She is oriented to person, place, and time and well-developed, well-nourished, and in no distress. No distress.  Obese   Neck: Normal range of motion. Neck supple. No thyromegaly present.  Cardiovascular: Normal rate, regular rhythm, normal heart sounds and intact distal pulses.  Exam reveals no gallop.   No murmur heard. Pulmonary/Chest: Effort normal and breath sounds normal. No respiratory distress. She has no wheezes. She has no rales. She exhibits no tenderness.  Lymphadenopathy:    She  has no cervical adenopathy.  Neurological: She is alert and oriented to person, place, and time. Gait normal. GCS score is 15.  Skin: Skin is warm and dry. No rash noted. No erythema. No pallor.  Psychiatric: Memory, affect and judgment normal.  Nursing note and vitals reviewed.  Assessment/Plan:  1. Encounter to establish care - Follow up for CPE or any acute issue  - Work on weight loss through diet and exercise   2. Familial hypercholesterolemia - Currently taking Zetia.  - Consider lipid clinic consult for PCSK 9 inhibitors  - Will check lipid panel at CPE   Shirline Freesory Kymora Sciara, NP

## 2017-07-30 ENCOUNTER — Ambulatory Visit (INDEPENDENT_AMBULATORY_CARE_PROVIDER_SITE_OTHER): Payer: 59 | Admitting: Adult Health

## 2017-07-30 ENCOUNTER — Other Ambulatory Visit: Payer: Self-pay | Admitting: Adult Health

## 2017-07-30 ENCOUNTER — Encounter: Payer: Self-pay | Admitting: Adult Health

## 2017-07-30 ENCOUNTER — Encounter: Payer: Self-pay | Admitting: Family Medicine

## 2017-07-30 VITALS — BP 106/74 | Temp 98.3°F | Ht 67.0 in | Wt 243.0 lb

## 2017-07-30 DIAGNOSIS — Z0000 Encounter for general adult medical examination without abnormal findings: Secondary | ICD-10-CM | POA: Diagnosis not present

## 2017-07-30 DIAGNOSIS — E7801 Familial hypercholesterolemia: Secondary | ICD-10-CM | POA: Diagnosis not present

## 2017-07-30 DIAGNOSIS — E669 Obesity, unspecified: Secondary | ICD-10-CM

## 2017-07-30 DIAGNOSIS — Z111 Encounter for screening for respiratory tuberculosis: Secondary | ICD-10-CM | POA: Diagnosis not present

## 2017-07-30 DIAGNOSIS — Z23 Encounter for immunization: Secondary | ICD-10-CM | POA: Diagnosis not present

## 2017-07-30 DIAGNOSIS — E78019 Familial hypercholesterolemia, unspecified: Secondary | ICD-10-CM

## 2017-07-30 LAB — BASIC METABOLIC PANEL
BUN: 8 mg/dL (ref 6–23)
CHLORIDE: 103 meq/L (ref 96–112)
CO2: 27 meq/L (ref 19–32)
Calcium: 9.3 mg/dL (ref 8.4–10.5)
Creatinine, Ser: 0.74 mg/dL (ref 0.40–1.20)
GFR: 116.33 mL/min (ref >60.00)
Glucose, Bld: 94 mg/dL (ref 70–99)
POTASSIUM: 4.1 meq/L (ref 3.5–5.1)
Sodium: 139 mEq/L (ref 135–145)

## 2017-07-30 LAB — LIPID PANEL
CHOLESTEROL: 221 mg/dL — AB (ref 0–200)
HDL: 37.8 mg/dL — ABNORMAL LOW (ref >39.00)
LDL CALC: 154 mg/dL — AB (ref 0–99)
NonHDL: 183.07
Total CHOL/HDL Ratio: 6
Triglycerides: 147 mg/dL (ref 0.0–149.0)
VLDL: 29.4 mg/dL (ref 0.0–40.0)

## 2017-07-30 LAB — CBC WITH DIFFERENTIAL/PLATELET
BASOS PCT: 0.6 % (ref 0.0–3.0)
Basophils Absolute: 0 10*3/uL (ref 0.0–0.1)
EOS PCT: 1.1 % (ref 0.0–5.0)
Eosinophils Absolute: 0.1 10*3/uL (ref 0.0–0.7)
HEMATOCRIT: 41.1 % (ref 36.0–46.0)
HEMOGLOBIN: 13.8 g/dL (ref 12.0–15.0)
LYMPHS PCT: 34.6 % (ref 12.0–46.0)
Lymphs Abs: 2.2 10*3/uL (ref 0.7–4.0)
MCHC: 33.5 g/dL (ref 30.0–36.0)
MCV: 81.4 fl (ref 78.0–100.0)
Monocytes Absolute: 0.6 10*3/uL (ref 0.1–1.0)
Monocytes Relative: 8.9 % (ref 3.0–12.0)
NEUTROS ABS: 3.5 10*3/uL (ref 1.4–7.7)
Neutrophils Relative %: 54.8 % (ref 43.0–77.0)
PLATELETS: 233 10*3/uL (ref 150.0–400.0)
RBC: 5.05 Mil/uL (ref 3.87–5.11)
RDW: 14.1 % (ref 11.5–15.5)
WBC: 6.5 10*3/uL (ref 4.0–10.5)

## 2017-07-30 LAB — HEPATIC FUNCTION PANEL
ALBUMIN: 4 g/dL (ref 3.5–5.2)
ALT: 10 U/L (ref 0–35)
AST: 11 U/L (ref 0–37)
Alkaline Phosphatase: 74 U/L (ref 39–117)
Bilirubin, Direct: 0.1 mg/dL (ref 0.0–0.3)
TOTAL PROTEIN: 7.2 g/dL (ref 6.0–8.3)
Total Bilirubin: 0.4 mg/dL (ref 0.2–1.2)

## 2017-07-30 LAB — HEMOGLOBIN A1C: Hgb A1c MFr Bld: 5.4 % (ref 4.6–6.5)

## 2017-07-30 LAB — TSH: TSH: 0.84 u[IU]/mL (ref 0.35–4.50)

## 2017-07-30 NOTE — Patient Instructions (Signed)
It was great seeing you today   I will follow up with you regarding your labs   Continue to work on diet and exercise to lose weight

## 2017-07-30 NOTE — Progress Notes (Signed)
Subjective:    Patient ID: Pamela Hall, female    DOB: April 13, 1986, 31 y.o.   MRN: [id number]  HPI  Patient presents for yearly preventative medicine examination. She is a pleasant 31 year old female who  has a past medical history of Abnormal Pap smear (2008) and High cholesterol.   Hx of Familial hypercholesterolemia- she takes Zetia. Is unable to take Statins due to side effects   All immunizations and health maintenance protocols were reviewed with the patient and needed orders were placed.  Appropriate screening laboratory values were ordered for the patient including screening of hyperlipidemia, renal function and hepatic function.  Medication reconciliation,  past medical history, social history, problem list and allergies were reviewed in detail with the patient  Goals were established with regard to weight loss, exercise, and  diet in compliance with medications. She walks 2-3 miles per week.  She is working on cleaning up her diet.   Wt Readings from Last 3 Encounters:  07/30/17 243 lb (110.2 kg)  06/21/17 240 lb (108.9 kg)  04/22/17 244 lb 6.4 oz (110.9 kg)   She is up to date of her GYN exam and is followed by [facility]   She does not do routine vision screens but does do routine dental screens   She needs a - Quantiferon tb gold assay done for work   Review of Systems  Constitutional: Negative.   HENT: Negative.   Eyes: Negative.   Respiratory: Negative.   Cardiovascular: Negative.   Gastrointestinal: Negative.   Endocrine: Negative.   Genitourinary: Negative.   Musculoskeletal: Negative.   Skin: Negative.   Allergic/Immunologic: Negative.   Neurological: Negative.   Hematological: Negative.   Psychiatric/Behavioral: Negative.   All other systems reviewed and are negative.  Past Medical History:  Diagnosis Date  . Abnormal Pap smear 2008   COLPO;WAS NORMAL;LAST PAP 10/2011  . High cholesterol     Social History    Social History  . Marital status: Married    Spouse name: Leeanne Rio  . Number of children: 1  . Years of education: 38   Occupational History  . NP  [employer]   Social History Main Topics  . Smoking status: Never Smoker  . Smokeless tobacco: Never Used  . Alcohol use No     Comment: SOCIAL DRINKER  . Drug use: No  . Sexual activity: Not Currently    Partners: Male    Birth control/ protection: Surgical   Other Topics Concern  . Not on file   Social History Narrative  . No narrative on file    Past Surgical History:  Procedure Laterality Date  . TUBAL LIGATION  09/30/2012   Procedure: POST PARTUM TUBAL LIGATION;  Surgeon: Michael Litter, MD;  Location: [facility];  Service: Gynecology;  Laterality: Bilateral;  . WISDOM TOOTH EXTRACTION  2003   ALL 4 EXTRACTED    Family History  Problem Relation Age of Onset  . Diabetes type II Maternal Grandmother   . Hypertension Maternal Grandmother   . Heart disease Maternal Grandmother   . Anesthesia problems Maternal Grandmother        ALLERGIC TO GENERAL ANESTHESIA  . Other Maternal Grandmother   . Breast cancer Maternal Grandmother   . Schizophrenia Mother   . Bipolar disorder Mother   . Heart disease Mother   . Heart attack Mother     Allergies  Allergen Reactions  . Statins     Current Outpatient  Prescriptions on File Prior to Visit  Medication Sig Dispense Refill  . diazepam (VALIUM) 5 MG tablet Take 5 mg by mouth as needed for anxiety.    Marland Kitchen ezetimibe (ZETIA) 10 MG tablet Take 1 tablet (10 mg total) by mouth daily. 90 tablet 3  . valACYclovir (VALTREX) 500 MG tablet Take 1 tablet (500 mg total) by mouth as needed. 90 tablet 3   No current facility-administered medications on file prior to visit.     BP 106/74 (BP Location: Left Arm)   Temp 98.3 F (36.8 C) (Oral)   Ht  (1.702 m)   Wt 243 lb (110.2 kg)   BMI 38.06 kg/m       Objective:   Physical Exam  Constitutional: She is oriented to  person, place, and time. She appears well-developed and well-nourished. No distress.  Obese   HENT:  Head: Normocephalic and atraumatic.  Right Ear: External ear normal.  Left Ear: External ear normal.  Nose: Nose normal.  Mouth/Throat: Oropharynx is clear and moist. No oropharyngeal exudate.  Eyes: Pupils are equal, round, and reactive to light. Conjunctivae and EOM are normal. Right eye exhibits no discharge. Left eye exhibits no discharge.  Neck: Normal range of motion. Neck supple. No JVD present. No tracheal deviation present. No thyromegaly present.  Cardiovascular: Normal rate, regular rhythm, normal heart sounds and intact distal pulses.  Exam reveals no gallop and no friction rub.   No murmur heard. Pulmonary/Chest: Effort normal and breath sounds normal. No stridor. No respiratory distress. She has no wheezes. She has no rales. She exhibits no tenderness.  Abdominal: Soft. Bowel sounds are normal. She exhibits no distension and no mass. There is no tenderness. There is no rebound and no guarding.  Genitourinary:  Genitourinary Comments: Deferred: Done by GYN   Musculoskeletal: Normal range of motion. She exhibits no edema, tenderness or deformity.  Lymphadenopathy:    She has no cervical adenopathy.  Neurological: She is alert and oriented to person, place, and time. She has normal reflexes. She displays normal reflexes. No cranial nerve deficit. She exhibits normal muscle tone. Coordination normal.  Skin: Skin is warm and dry. No rash noted. She is not diaphoretic. No erythema. No pallor.  Psychiatric: She has a normal mood and affect. Her behavior is normal. Judgment and thought content normal.  Nursing note and vitals reviewed.     Assessment & Plan:  1. Routine general medical examination at a health care facility - Encouraged life style modification  - Basic metabolic panel - CBC with Differential/Platelet - Hemoglobin A1c - Hepatic function panel - Lipid panel -  TSH  2. Familial hypercholesterolemia - Likely refer to lipid clinic - Basic metabolic panel - CBC with Differential/Platelet - Hemoglobin A1c - Hepatic function panel - Lipid panel - TSH  3. Visit for TB skin test  - Quantiferon tb gold assay  4. Obesity (BMI 35.0-39.9 without comorbidity) - Educated on the importance of life style modifications - Basic metabolic panel - CBC with Differential/Platelet - Hemoglobin A1c - Hepatic function panel - Lipid panel - TSH   Shirline Frees, NP

## 2017-08-01 LAB — QUANTIFERON TB GOLD ASSAY (BLOOD)
QUANTIFERON(R)-TB GOLD: NEGATIVE
Quantiferon Nil Value: 0.02 IU/mL
Quantiferon Tb Ag Minus Nil Value: <0 IU/mL

## 2017-12-25 ENCOUNTER — Encounter: Payer: Self-pay | Admitting: Adult Health

## 2017-12-25 ENCOUNTER — Ambulatory Visit (INDEPENDENT_AMBULATORY_CARE_PROVIDER_SITE_OTHER): Payer: 59 | Admitting: Adult Health

## 2017-12-25 VITALS — BP 118/64 | Temp 98.4°F | Wt 240.0 lb

## 2017-12-25 DIAGNOSIS — E7801 Familial hypercholesterolemia: Secondary | ICD-10-CM | POA: Diagnosis not present

## 2017-12-25 DIAGNOSIS — R079 Chest pain, unspecified: Secondary | ICD-10-CM | POA: Diagnosis not present

## 2017-12-25 MED ORDER — PANTOPRAZOLE SODIUM 40 MG PO TBEC: 40.0000 mg | DELAYED_RELEASE_TABLET | Freq: Every day | ORAL | 1 refills | Status: DC

## 2017-12-25 NOTE — Progress Notes (Signed)
Subjective:    Patient ID: Pamela Hall, female    DOB: 03/14/1986, 32 y.o.   MRN: [id number]  HPI 32 year old female who  has a past medical history of Abnormal Pap smear (2008) and High cholesterol.  She presents to the office today for the acute complaint of substernal chest pain that has been present emergently for approximately 2 weeks.  Pain is described as sharp and radiates up into her shoulders.  She has no history of cardiac issues.  Reports that she has severe GERD-like symptoms and has been taking over-the-counter Zantac but takes this inconsistently.  Over the last week she has been taking Zantac daily and has noticed a decrease in the amount of substernal chest pain she has been having.  She does report that during this week the 2 episodes that she has had has been prior to eating and then resolve once she has some food.  She denies any abdominal pain, nausea, vomiting, radiating pain up the jaw or down the left arm.  She would also like to be referred again to lipid clinic. She never heard from the the first time she was referred  Review of Systems  See HPI   Past Medical History:  Diagnosis Date  . Abnormal Pap smear 2008   COLPO;WAS NORMAL;LAST PAP 10/2011  . High cholesterol     Social History   Socioeconomic History  . Marital status: Married    Spouse name: Leeanne RioNTHONY JOHNSON  . Number of children: 1  . Years of education: 3117  . Highest education level: Not on file  Social Needs  . Financial resource strain: Not on file  . Food insecurity - worry: Not on file  . Food insecurity - inability: Not on file  . Transportation needs - medical: Not on file  . Transportation needs - non-medical: Not on file  Occupational History  . Occupation: NP     Employer: [employer]  Tobacco Use  . Smoking status: Never Smoker  . Smokeless tobacco: Never Used  Substance and Sexual Activity  . Alcohol use: No    Alcohol/week: 0.0 oz    Types: 1 - 2  Glasses of wine per week    Comment: SOCIAL DRINKER  . Drug use: No  . Sexual activity: Not Currently    Partners: Male    Birth control/protection: Surgical  Other Topics Concern  . Not on file  Social History Narrative  . Not on file    Past Surgical History:  Procedure Laterality Date  . TUBAL LIGATION  09/30/2012   Procedure: POST PARTUM TUBAL LIGATION;  Surgeon: Michael LitterNaima A Dillard, MD;  Location: [facility];  Service: Gynecology;  Laterality: Bilateral;  . WISDOM TOOTH EXTRACTION  2003   ALL 4 EXTRACTED    Family History  Problem Relation Age of Onset  . Diabetes type II Maternal Grandmother   . Hypertension Maternal Grandmother   . Heart disease Maternal Grandmother   . Anesthesia problems Maternal Grandmother        ALLERGIC TO GENERAL ANESTHESIA  . Other Maternal Grandmother   . Breast cancer Maternal Grandmother   . Schizophrenia Mother   . Bipolar disorder Mother   . Heart disease Mother   . Heart attack Mother     Allergies  Allergen Reactions  . Statins     Current Outpatient Medications on File Prior to Visit  Medication Sig Dispense Refill  . diazepam (VALIUM) 5 MG tablet Take 5 mg by  mouth as needed for anxiety.    Marland Kitchen ezetimibe (ZETIA) 10 MG tablet Take 1 tablet (10 mg total) by mouth daily. 90 tablet 3  . valACYclovir (VALTREX) 500 MG tablet Take 1 tablet (500 mg total) by mouth as needed. 90 tablet 3   No current facility-administered medications on file prior to visit.     BP 118/64   Temp 98.4 F (36.9 C) (Oral)   Wt 240 lb (108.9 kg)   BMI 37.59 kg/m        Objective:   Physical Exam  Constitutional: She is oriented to person, place, and time. She appears well-developed and well-nourished. No distress.  Obese    Cardiovascular: Normal rate, regular rhythm, normal heart sounds and intact distal pulses. Exam reveals no gallop and no friction rub.  No murmur heard. Pulmonary/Chest: Effort normal and breath sounds normal. No respiratory  distress. She has no wheezes. She has no rales. She exhibits no tenderness.  Abdominal: Soft. Bowel sounds are normal. She exhibits no distension and no mass. There is no hepatosplenomegaly, splenomegaly or hepatomegaly. There is generalized tenderness. There is no rigidity, no rebound, no guarding, no tenderness at McBurney's point and negative Murphy's sign. No hernia.  Neurological: She is alert and oriented to person, place, and time.  Skin: Skin is warm and dry. No rash noted. She is not diaphoretic. No erythema. No pallor.  Psychiatric: She has a normal mood and affect. Her behavior is normal. Judgment and thought content normal.  Nursing note and vitals reviewed.     Assessment & Plan:  1. Chest pain, unspecified type -Chest pain seems to be more related to acid reflux and any cardiac issue.  EKG was normal sinus rhythm with a rate of 91.  We will have her stop taking over-the-counter acid indigestion medication and start Protonix 40 mg daily.  Advised to take this medication daily at the same time.  Follow-up in 2 weeks no improvement or sooner if needed - EKG 12-Lead- NSR, rate 91 - pantoprazole (PROTONIX) 40 MG tablet; Take 1 tablet (40 mg total) by mouth daily.  Dispense: 90 tablet; Refill: 1  2. Familial hypercholesterolemia - AMB Referral to [facility]   Shirline Frees, NP

## 2018-02-20 ENCOUNTER — Other Ambulatory Visit: Payer: Self-pay | Admitting: Obstetrics and Gynecology

## 2018-02-26 ENCOUNTER — Ambulatory Visit (INDEPENDENT_AMBULATORY_CARE_PROVIDER_SITE_OTHER): Payer: 59 | Admitting: Internal Medicine

## 2018-02-26 ENCOUNTER — Encounter: Payer: Self-pay | Admitting: Internal Medicine

## 2018-02-26 VITALS — BP 118/76 | HR 81 | Ht 67.0 in | Wt 242.0 lb

## 2018-02-26 DIAGNOSIS — E7801 Familial hypercholesterolemia: Secondary | ICD-10-CM | POA: Diagnosis not present

## 2018-02-26 MED ORDER — ROSUVASTATIN CALCIUM 20 MG PO TABS: 20.0000 mg | ORAL_TABLET | Freq: Every day | ORAL | 3 refills | Status: DC

## 2018-02-26 NOTE — Patient Instructions (Addendum)
Medication Instructions:   START crestor  once daily  Labwork:  Your physician recommends that you return for lab work FASTING to check cholesterol   Testing/Procedures:  Dr. Rennis Golden has ordered a CT coronary calcium score. This test is done at [address]. This is a $150 out of pocket cost.   Follow-Up:  Your physician recommends that you schedule a follow-up appointment in 2-3 months with Dr. Rennis Golden (lipid clinic)  You have been referred to Dr. Sidney Ace (geneticist) [address]    If you need a refill on your cardiac medications before your next appointment, please call your pharmacy.  Any Other Special Instructions Will Be Listed Below (If Applicable).   Coronary CalciumScan A coronary calcium scan is an imaging test used to look for deposits of calcium and other fatty materials (plaques) in the inner lining of the blood vessels of the heart (coronary arteries). These deposits of calcium and plaques can partly clog and narrow the coronary arteries without producing any symptoms or warning signs. This puts a person at risk for a heart attack. This test can detect these deposits before symptoms develop. Tell a health care provider about:  Any allergies you have.  All medicines you are taking, including vitamins, herbs, eye drops, creams, and over-the-counter medicines.  Any problems you or family members have had with anesthetic medicines.  Any blood disorders you have.  Any surgeries you have had.  Any medical conditions you have.  Whether you are pregnant or may be pregnant. What are the risks? Generally, this is a safe procedure. However, problems may occur, including:  Harm to a pregnant woman and her unborn baby. This test involves the use of radiation. Radiation exposure can be dangerous to a pregnant woman and her unborn baby. If you are pregnant, you generally should not have this procedure done.  Slight increase  in the risk of cancer. This is because of the radiation involved in the test. What happens before the procedure? No preparation is needed for this procedure. What happens during the procedure?  You will undress and remove any jewelry around your neck or chest.  You will put on a hospital gown.  Sticky electrodes will be placed on your chest. The electrodes will be connected to an electrocardiogram (ECG) machine to record a tracing of the electrical activity of your heart.  A CT scanner will take pictures of your heart. During this time, you will be asked to lie still and hold your breath for 2-3 seconds while a picture of your heart is being taken. The procedure may vary among health care providers and hospitals. What happens after the procedure?  You can get dressed.  You can return to your normal activities.  It is up to you to get the results of your test. Ask your health care provider, or the department that is doing the test, when your results will be ready. Summary  A coronary calcium scan is an imaging test used to look for deposits of calcium and other fatty materials (plaques) in the inner lining of the blood vessels of the heart (coronary arteries).  Generally, this is a safe procedure. Tell your health care provider if you are pregnant or may be pregnant.  No preparation is needed for this procedure.  A CT scanner will take pictures of your heart.  You can return to your normal activities after the scan is done. This information is not intended to  replace advice given to you by your health care provider. Make sure you discuss any questions you have with your health care provider. Document Released: 03/29/2008 Document Revised: 08/20/2016 Document Reviewed: 08/20/2016 Elsevier Interactive Patient Education  2017 [employer].

## 2018-02-26 NOTE — Progress Notes (Signed)
OFFICE CONSULT NOTE  Chief Complaint:  Evaluate possible familial hyperlipidemia  Primary Care Physician: Shirline Frees, NP  HPI:  Pamela Hall is a 32 y.o. female who is being seen today for the evaluation of the meal hyperlipidemia at the request of Shirline Frees, NP.  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  She was under the care of Tyrone Apple, MD, who had noted on routine physical that she had markedly elevated cholesterol in 2017 during a routine physical exam.  Labs at the time showed total cholesterol of 234, triglycerides 231, HDL 34 and direct LDL 158.  Non-HDL cholesterol 200.  She had not previously had an assessment.  She was started on pravastatin  daily but developed myalgias with this.  She was then decreased to every other day dosing and still had problems with the medication.  Then it was discontinued.  She was started on ezetimibe 10 mg daily and has maintained on that.  She had a repeat lipid profile about 7 months ago which showed total cholesterol 221, triglycerides 147, HDL 38 and LDL-C of 154.  She reports a strong family history of premature coronary artery disease, including her grandmother who had diabetes and coronary artery disease starting in her 93s and her mother who had an MI when her 92s.  She also has a brother who she does not think is had cholesterol testing, and an 54-year-old son and 72-year-old daughter.  They have not had cholesterol testing.  She has no known coronary events.  Past medical history also significant for GERD and she uses a small amount of Valium for anxiety as well as Valtrex as needed.  Reports being physically active and exercises regularly without symptoms.  She reports her diet could be better, and generally consist of low saturated fats, and attempt to lower carbohydrates, but she reports  being a "stress eater" and does like sweets.  PMHx:  Past Medical History:  Diagnosis Date  . Abnormal Pap smear 2008   COLPO;WAS NORMAL;LAST PAP 10/2011  . High cholesterol     Past Surgical History:  Procedure Laterality Date  . TUBAL LIGATION  09/30/2012   Procedure: POST PARTUM TUBAL LIGATION;  Surgeon: Michael Litter, MD;  Location: [facility];  Service: Gynecology;  Laterality: Bilateral;  . WISDOM TOOTH EXTRACTION  2003   ALL 4 EXTRACTED    FAMHx:  Family History  Problem Relation Age of Onset  . Diabetes type II Maternal Grandmother   . Hypertension Maternal Grandmother   . Heart disease Maternal Grandmother   . Anesthesia problems Maternal Grandmother        ALLERGIC TO GENERAL ANESTHESIA  . Other Maternal Grandmother   . Breast cancer Maternal Grandmother   . Schizophrenia Mother   . Bipolar disorder Mother   . Heart disease Mother   . Heart attack Mother     SOCHx:   reports that she has never smoked. She has never used smokeless tobacco. She reports that she does not drink alcohol or use drugs.  ALLERGIES:  Allergies  Allergen Reactions  . Statins     ROS: Pertinent items noted in HPI and remainder of comprehensive ROS otherwise negative.  HOME MEDS: Current Outpatient Medications on File Prior to Visit  Medication Sig Dispense Refill  . diazepam (VALIUM) 5 MG tablet Take 5 mg by mouth as needed for anxiety.    Marland Kitchen  ezetimibe (ZETIA) 10 MG tablet Take 1 tablet (10 mg total) by mouth daily. 90 tablet 3  . pantoprazole (PROTONIX) 40 MG tablet Take 1 tablet (40 mg total) by mouth daily. 90 tablet 1  . valACYclovir (VALTREX) 500 MG tablet Take 1 tablet (500 mg total) by mouth as needed. 90 tablet 3   No current facility-administered medications on file prior to visit.     LABS/IMAGING: No results found for this or any previous visit (from the past 48 hour(s)). No results found.  LIPID PANEL:    Component Value Date/Time   CHOL 221 (H) 07/30/2017 0856     TRIG 147.0 07/30/2017 0856   HDL 37.80 (L) 07/30/2017 0856   CHOLHDL 6 07/30/2017 0856   VLDL 29.4 07/30/2017 0856   LDLCALC 154 (H) 07/30/2017 0856    WEIGHTS: Wt Readings from Last 3 Encounters:  02/26/18 242 lb (109.8 kg)  12/25/17 240 lb (108.9 kg)  07/30/17 243 lb (110.2 kg)    VITALS: BP 118/76   Pulse 81   Ht  (1.702 m)   Wt 242 lb (109.8 kg)   BMI 37.90 kg/m   EXAM: General appearance: alert and no distress Neck: no carotid bruit, no JVD and thyroid not enlarged, symmetric, no tenderness/mass/nodules Lungs: clear to auscultation bilaterally Heart: regular rate and rhythm, S1, S2 normal, no murmur, click, rub or gallop Abdomen: soft, non-tender; bowel sounds normal; no masses,  no organomegaly and obese Extremities: extremities normal, atraumatic, no cyanosis or edema Pulses: 2+ and symmetric Skin: Skin color, texture, turgor normal. No rashes or lesions Neurologic: Grossly normal Psych: Pleasant  EKG: Deferred- personally reviewed  ASSESSMENT: 1. Possible FH - low Dutch score of 2  PLAN: 1.   At this point Pamela Hall has dyslipidemia and a strong family history of early onset heart disease.  According to current guidelines, it is recommended that we lower LDL-C greater than 50% with high intensity statin therapy.  I would recommend rosuvastatin 20 mg daily.  If this is not tolerated then will consider decreasing the dose to 5 mg every other day.  She may likely be statin intolerant.  She should remain on ezetimibe.  According to her.score, primarily driven by LDL not greater than 190, the likelihood of FH is low, however family history is consistent with this on her mother side.  In addition studies have indicated patients with FH can have LDLs as low as 150.  I do not think we can exclude it yet based on those criteria.  It would be helpful to gather more information as to whether or not she has premature onset coronary disease.  I am recommending  coronary artery calcium score.  Finally, she should consider genetic testing.  Given her family history this may help further elucidate whether she has any inherited genetic condition and could be helpful in screening both her brother and her children.  She seems agreeable to this approach.  Follow-up with me in 3 months with a lipid profile. Thanks for the kind referral.  Chrystie Nose, MD, [facility]  [place]  [facility]  Medical Director of the [facility] Lipid Disorders &  Cardiovascular Risk Reduction Clinic Diplomate of the American Board of Clinical Lipidology Attending Cardiologist  Direct Dial: [phone]  Fax: [phone]  Website:  [email or web address] Nicely Kejon Feild 02/26/2018, 1:00 PM

## 2018-02-28 ENCOUNTER — Other Ambulatory Visit: Payer: Self-pay | Admitting: *Deleted

## 2018-02-28 DIAGNOSIS — E7801 Familial hypercholesterolemia: Secondary | ICD-10-CM

## 2018-02-28 DIAGNOSIS — Z8249 Family history of ischemic heart disease and other diseases of the circulatory system: Secondary | ICD-10-CM

## 2018-03-13 LAB — NMR, LIPOPROFILE
Cholesterol, Total: 212 mg/dL — ABNORMAL HIGH (ref 100–199)
HDL PARTICLE NUMBER: 29.3 umol/L — AB (ref >=30.5)
HDL-C: 37 mg/dL — AB (ref >39)
LDL Particle Number: 2028 nmol/L — ABNORMAL HIGH (ref <1000)
LDL SIZE: 19.9 nm — AB (ref >20.5)
LDL-C: 144 mg/dL — ABNORMAL HIGH (ref 0–99)
LP-IR SCORE: 65 — AB (ref <=45)
SMALL LDL PARTICLE NUMBER: 1481 nmol/L — AB (ref <=527)
Triglycerides: 154 mg/dL — ABNORMAL HIGH (ref 0–149)

## 2018-03-13 LAB — LIPOPROTEIN A (LPA): LIPOPROTEIN (A): 106 nmol/L — AB (ref <75)

## 2018-03-13 LAB — APOLIPOPROTEIN B: APOLIPOPROTEIN B: 137 mg/dL — AB (ref <90)

## 2018-03-14 ENCOUNTER — Inpatient Hospital Stay: Admission: RE | Admit: 2018-03-14 | Payer: 59 | Source: Ambulatory Visit

## 2018-03-19 ENCOUNTER — Ambulatory Visit
Admission: RE | Admit: 2018-03-19 | Discharge: 2018-03-19 | Disposition: A | Discharge status: Home / Self Care | Payer: Self-pay | Source: Ambulatory Visit | Attending: Internal Medicine | Admitting: Internal Medicine

## 2018-03-19 DIAGNOSIS — Z8249 Family history of ischemic heart disease and other diseases of the circulatory system: Secondary | ICD-10-CM

## 2018-03-19 DIAGNOSIS — E7801 Familial hypercholesterolemia: Secondary | ICD-10-CM

## 2018-04-16 ENCOUNTER — Other Ambulatory Visit: Payer: Self-pay | Admitting: *Deleted

## 2018-04-16 DIAGNOSIS — E7801 Familial hypercholesterolemia: Secondary | ICD-10-CM

## 2018-04-16 DIAGNOSIS — Z8249 Family history of ischemic heart disease and other diseases of the circulatory system: Secondary | ICD-10-CM

## 2018-05-08 ENCOUNTER — Ambulatory Visit: Payer: 59 | Admitting: Genetic Counselor

## 2018-05-08 ENCOUNTER — Other Ambulatory Visit: Payer: Self-pay | Admitting: *Deleted

## 2018-05-08 DIAGNOSIS — E7801 Familial hypercholesterolemia: Secondary | ICD-10-CM

## 2018-05-08 DIAGNOSIS — Z8249 Family history of ischemic heart disease and other diseases of the circulatory system: Secondary | ICD-10-CM

## 2018-05-15 NOTE — Consult Note (Signed)
Pre-test GC notes  Pamela Hall was referred for genetic consult of familial hypercholesterolemia (FH). We walked through the risk factors that can lead to hypercholesterolemia and discussed the characteristic features of a genetic condition, namely absence of risk factors, early age of presentation, increased disease severity and family history of the condition. The clinical manifestations of FH were reviewed.   We went through the molecular pathogenesis of FH and I informed her that FH is primarily caused by pathogenic variants in three genes, namely APOB, LDLR and PCSK9. These pathogenic variants impact LDLR synthesis, degradation and recycling in cells. We then walked through autosomal dominant inheritance pattern and viewed pedigree of families with heterozygous FH (HeFH) and homozygous FH (HoFH). I informed her that digenic or compound heterozygous mutations in APOB, LDLR and PCSK9 genes can cause HoFH.   We reviewed the likely outcomes of FH genetic testing. Based on the diagnostic criteria for FH, yields can range from 50%-90%. A positive yield is observed in  ~63% of patients with a definite clinical diagnosis of FH. I also made clear to her that a negative test does not exclude a genetic basis for FH. Limitations in current genetic testing methodology can produce a negative result. Variants of unknown significance (VUS) can be seen in some cases. I explained to her that typically a VUS is so classified if the variant is not well understood as very few individuals have been reported to harbor this variant or its role in gene function has not been elucidated. Screening other first-degree family members by genetic testing was also discussed. Additionally, we briefly touched upon the molecular basis of the different treatment modalities that are currently available.  Her medical and 4-generation family history was obtained. 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 She informs me that she was found to have elevated cholesterol at age 28 during her annual physical. She has been having annual physicals since the age of 20 with normal cholesterol levels until then. She reports that at 28, her LDL-C was at 190mg/dL. She was prescribed pravastatin and suffered terribly from myalgias. Her LDL-C lowered to 170 at age 29 she stopped taking statins and switched to zetia. She reports no side effect from this medication. However, her LDL-C levels have not fallen as expected. She began seeing Dr. Hilty at his Lipid clinic in 2019 to help manage and treat her hyperlipidemia.  She has not had any adverse events so far.   Family history Pamela Hall (III.2) has an older brother (III.1) who is in good health. She has two children; an 8-year old son (IV.3) and 5-year old (IV.4) daughter.   She is not in touch with her biological dad and is not aware of his or family's medical history.   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 Pamela Hall tells me that age 44 she collapsed and was unresponsive from a coronary event. She had a stent placed in her coronary artery and is now on Lipitor.  Pamela Hall has a maternal uncle (II.3) who has heart disease, the details of which she is not aware. Her maternal grandmother (I.2) had elevated cholesterol and was on Lipitor. She was also diabetic and had a pacemaker. She died of   breast cancer at 70.   Impression and Plans  In summary, Pamela Hall's clinical presentation, early age of diagnosis in the absence of known risk factors and significant family history of hyperlipidemia is indicative of a genetic condition. It is highly likely that she has inherited her condition from her mother. Genetic  testing for FH is highly recommended. The genetic test should include APOB, LDLR and PCSK9 genes. Identifying the causative gene variant will help in directing appropriate treatment strategies to reduce her cholesterol levels and risk of an adverse event. It will also help stratify risk of FH in her family. She has two children who are at a 50% risk of inheriting her condition.  We also reviewed insurance coverage for genetic testing. Most insurance providers mandate prior authorization for genetic testing. The laboratory can aid her in this process. Her insurance provider may take about 2-3 months to approve the testing. Testing will commence only after her insurance provider has approved it as medically necessary. Since she works with [employer] she is familiar with their process and verbalized understanding of this.  Plan Pamela Hall is interested in genetic testing for FH. Blood was drawn today and sent out for testing.                                                                                                                                                                                                                                                            Veron Senner, Ph.D, [facility] Clinical Molecular Geneticist  

## 2018-05-15 NOTE — Consult Note (Signed)
Pre-test GC notes  Pamela Hall was referred for genetic consult of familial hypercholesterolemia (FH). We walked through the risk factors that can lead to hypercholesterolemia and discussed the characteristic features of a genetic condition, namely absence of risk factors, early age of presentation, increased disease severity and family history of the condition. The clinical manifestations of FH were reviewed.   We went through the molecular pathogenesis of FH and I informed her that FH is primarily caused by pathogenic variants in three genes, namely APOB, LDLR and PCSK9. These pathogenic variants impact LDLR synthesis, degradation and recycling in cells. We then walked through autosomal dominant inheritance pattern and viewed pedigree of families with heterozygous FH (HeFH) and homozygous FH (HoFH). I informed her that digenic or compound heterozygous mutations in APOB, LDLR and PCSK9 genes can cause HoFH.   We reviewed the likely outcomes of FH genetic testing. Based on the diagnostic criteria for FH, yields can range from 50%-90%. A positive yield is observed in  ~63% of patients with a definite clinical diagnosis of FH. I also made clear to her that a negative test does not exclude a genetic basis for FH. Limitations in current genetic testing methodology can produce a negative result. Variants of unknown significance (VUS) can be seen in some cases. I explained to her that typically a VUS is so classified if the variant is not well understood as very few individuals have been reported to harbor this variant or its role in gene function has not been elucidated. Screening other first-degree family members by genetic testing was also discussed. Additionally, we briefly touched upon the molecular basis of the different treatment modalities that are currently available.  Her medical and 4-generation family history was obtained. 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 She informs me that she was found to have elevated cholesterol at age 72 during her annual physical. She has been having annual physicals since the age of 51 with normal cholesterol levels until then. She reports that at 28, her LDL-C was at 190mg /dL. She was prescribed pravastatin and suffered terribly from myalgias. Her LDL-C lowered to 170 at age 60 she stopped taking statins and switched to zetia. She reports no side effect from this medication. However, her LDL-C levels have not fallen as expected. She began seeing Dr. Rennis Golden at his Lipid clinic in 2019 to help manage and treat her hyperlipidemia.  She has not had any adverse events so far.   Family history [name] (III.2) has an older brother (III.1) who is in good health. She has two children; an 22-year old son (IV.106) and 20-year old (IV.4) daughter.   She is not in touch with her biological dad and is not aware of his or family's medical history.   [name] (II.2) was told that her cholesterol level were high when she enrolled in the military at 62.She was however, not treated for this during her 4 year stint at the [employer]. [name] tells me that age 52 she collapsed and was unresponsive from a coronary event. She had a stent placed in her coronary artery and is now on Lipitor.  [name] has a maternal uncle (II.3) who has heart disease, the details of which she is not aware. Her maternal grandmother (I.2) had elevated cholesterol and was on Lipitor. She was also diabetic and had a pacemaker. She died of  breast cancer at 70.   Impression and Plans  In summary, Pamela Hall's clinical presentation, early age of diagnosis in the absence of known risk factors and significant family history of hyperlipidemia is indicative of a genetic condition. It is highly likely that she has inherited her condition from her mother. Genetic  testing for FH is highly recommended. The genetic test should include APOB, LDLR and PCSK9 genes. Identifying the causative gene variant will help in directing appropriate treatment strategies to reduce her cholesterol levels and risk of an adverse event. It will also help stratify risk of FH in her family. She has two children who are at a 50% risk of inheriting her condition.  We also reviewed insurance coverage for genetic testing. Most insurance providers mandate prior authorization for genetic testing. The laboratory can aid her in this process. Her insurance provider may take about 2-3 months to approve the testing. Testing will commence only after her insurance provider has approved it as medically necessary. Since [facility] with [facility] she is familiar with their process and verbalized understanding of this.  Plan [name] is interested in genetic testing for FH. Blood was drawn today and sent out for testing.                                                                                                                                                                                                                                                            Sidney AceSumy Hendrick Pavich, Ph.D, [facility] Clinical Molecular Geneticist

## 2018-05-23 ENCOUNTER — Ambulatory Visit (INDEPENDENT_AMBULATORY_CARE_PROVIDER_SITE_OTHER): Payer: 59 | Admitting: Internal Medicine

## 2018-05-23 ENCOUNTER — Encounter: Payer: Self-pay | Admitting: Internal Medicine

## 2018-05-23 DIAGNOSIS — E7801 Familial hypercholesterolemia: Secondary | ICD-10-CM | POA: Diagnosis not present

## 2018-05-23 MED ORDER — ROSUVASTATIN CALCIUM 20 MG PO TABS: 20.0000 mg | ORAL_TABLET | Freq: Every day | ORAL | 3 refills | Status: DC

## 2018-05-23 NOTE — Patient Instructions (Signed)
[facility] for [facility] [facility] [place]   Your physician recommends that you return for lab work in THREE MONTHS (fasting - to check cholesterol)  Your physician recommends that you schedule an appointment in about THREE MONTHS with Dr. Rennis GoldenHilty (after lab work) for a lipid clinic follow up

## 2018-05-23 NOTE — Progress Notes (Signed)
OFFICE CONSULT NOTE  Chief Complaint:  Follow-up dyslipidemia  Primary Care Physician: Shirline FreesNafziger, Cory, NP  HPI:  Pamela Hall is a 32 y.o. 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  Pamela Hall is a pleasant 32 year old female, who currently works as a Publishing rights managernurse practitioner with [employer], but previously had worked for [employer].  She was under the care of Tyrone AppleEquiena Scott, MD, who had noted on routine physical that she had markedly elevated cholesterol in 2017 during a routine physical exam.  Labs at the time showed total cholesterol of 234, triglycerides 231, HDL 34 and direct LDL 158.  Non-HDL cholesterol 200.  She had not previously had an assessment.  She was started on pravastatin 20mg  daily but developed myalgias with this.  She was then decreased to every other day dosing and still had problems with the medication.  Then it was discontinued.  She was started on ezetimibe 10 mg daily and has maintained on that.  She had a repeat lipid profile about 7 months ago which showed total cholesterol 221, triglycerides 147, HDL 38 and LDL-C of 154.  She reports a strong family history of premature coronary artery disease, including her grandmother who had diabetes and coronary artery disease starting in her 650s and her mother who had an MI when her 5450s.  She also has a brother who she does not think is had cholesterol testing, and an 29137-year-old son and 34-year-old daughter.  They have not had cholesterol testing.  She has no known coronary events.  Past medical history also significant for GERD and she uses a small amount of Valium for anxiety as well as Valtrex as needed.  Reports being physically active and exercises regularly without symptoms.  She reports her diet could be better, and generally consist of low saturated fats, and attempt to lower carbohydrates, but she reports being a "stress eater"  and does like sweets.  05/23/2018  Pamela Hall returns today for follow-up of her dyslipidemia.  Unfortunately there was a mixup on her ordering and her Crestor was sent to the wrong pharmacy.  We were not notified that she did not receive the medication and therefore we would not expect her numbers today to be any different than they were previously.  Overall she seems to be feeling pretty well.  She did meet on the interim with Dr.Joseph who felt that there is reasonable possibility she has an inherited dyslipidemia.  Blood work was sent to evaluate for FH.  She has continue to work on dietary changes.  I encouraged her to continue to reduce saturated fats in her diet and try to work on lower carbohydrates and weight loss.  PMHx:  Past Medical History:  Diagnosis Date  . Abnormal Pap smear 2008   COLPO;WAS NORMAL;LAST PAP 10/2011  . High cholesterol     Past Surgical History:  Procedure Laterality Date  . TUBAL LIGATION  09/30/2012   Procedure: POST PARTUM TUBAL LIGATION;  Surgeon: Michael LitterNaima A Dillard, MD;  Location: [facility];  Service: Gynecology;  Laterality: Bilateral;  . WISDOM TOOTH EXTRACTION  2003   ALL 4 EXTRACTED    FAMHx:  Family History  Problem Relation Age of Onset  . Diabetes type II Maternal Grandmother   . Hypertension Maternal Grandmother   . Heart disease Maternal Grandmother   . Anesthesia problems Maternal Grandmother        ALLERGIC TO GENERAL ANESTHESIA  .  Other Maternal Grandmother   . Breast cancer Maternal Grandmother   . Schizophrenia Mother   . Bipolar disorder Mother   . Heart disease Mother   . Heart attack Mother     SOCHx:   reports that she has never smoked. She has never used smokeless tobacco. She reports that she does not drink alcohol or use drugs.  ALLERGIES:  Allergies  Allergen Reactions  . Statins     ROS: Pertinent items noted in HPI and remainder of comprehensive ROS otherwise negative.  HOME MEDS: Current Outpatient Medications on File  Prior to Visit  Medication Sig Dispense Refill  . diazepam (VALIUM) 5 MG tablet Take 5 mg by mouth as needed for anxiety.    Marland Kitchen ezetimibe (ZETIA) 10 MG tablet Take 1 tablet (10 mg total) by mouth daily. 90 tablet 3  . pantoprazole (PROTONIX) 40 MG tablet Take 1 tablet (40 mg total) by mouth daily. 90 tablet 1  . valACYclovir (VALTREX) 500 MG tablet Take 1 tablet (500 mg total) by mouth as needed. 90 tablet 3  . rosuvastatin (CRESTOR) 20 MG tablet Take 1 tablet (20 mg total) by mouth daily. (Patient not taking: Reported on 05/23/2018) 90 tablet 3   No current facility-administered medications on file prior to visit.     LABS/IMAGING: No results found for this or any previous visit (from the past 48 hour(s)). No results found.  LIPID PANEL:    Component Value Date/Time   CHOL 221 (H) 07/30/2017 0856   TRIG 147.0 07/30/2017 0856   HDL 37.80 (L) 07/30/2017 0856   CHOLHDL 6 07/30/2017 0856   VLDL 29.4 07/30/2017 0856   LDLCALC 154 (H) 07/30/2017 0856    WEIGHTS: Wt Readings from Last 3 Encounters:  05/23/18 242 lb 12.8 oz (110.1 kg)  02/26/18 242 lb (109.8 kg)  12/25/17 240 lb (108.9 kg)    VITALS: BP 120/74   Pulse 68   Ht 5\' 7"  (1.702 m)   Wt 242 lb 12.8 oz (110.1 kg)   SpO2 99%   BMI 38.03 kg/m   EXAM: Deferred  EKG: Deferred  ASSESSMENT: 1. Possible FH - low Dutch score of 2  PLAN: 1.   Pamela Hall does have possible FH and underwent genetic testing.  Particle numbers from 2 months ago are over 2000 with an LDL-C 144, elevated ApoB and LPa.  Unfortunately, she did not get her Crestor due to a mixup with the pharmacies.  She now has a prescription to the correct pharmacy and will start taking her medication.  We will delay repeat lipid testing for 3 months and follow-up with me at that time.  TIME SPENT WITH PATIENT: 15 minutes of direct patient care. More than 50% of that time was spent on coordination of care and counseling regarding familial hyperlipidemia, dietary  modification and weight loss strategies.   Chrystie Nose, MD, [facility], FACP  [place]  [facility]  Medical Director of the Advanced Lipid Disorders &  Cardiovascular Risk Reduction Clinic Diplomate of the American Board of Clinical Lipidology Attending Cardiologist  Direct Dial: [phone]  Fax: [phone]  Website:  [email or web address] 05/23/2018, 8:18 AM

## 2018-06-24 ENCOUNTER — Other Ambulatory Visit: Payer: Self-pay | Admitting: Adult Health

## 2018-06-24 NOTE — Telephone Encounter (Signed)
30 Day supply of medication sent to the pharmacy.  Pt now due for cpx.

## 2018-06-24 NOTE — Telephone Encounter (Signed)
Due for cpx. 

## 2018-08-21 ENCOUNTER — Ambulatory Visit (INDEPENDENT_AMBULATORY_CARE_PROVIDER_SITE_OTHER): Payer: 59 | Admitting: Internal Medicine

## 2018-08-21 ENCOUNTER — Encounter: Payer: Self-pay | Admitting: Internal Medicine

## 2018-08-21 VITALS — BP 108/71 | HR 66 | Ht 67.5 in | Wt 245.0 lb

## 2018-08-21 DIAGNOSIS — E7801 Familial hypercholesterolemia: Secondary | ICD-10-CM | POA: Diagnosis not present

## 2018-08-21 DIAGNOSIS — E7841 Elevated Lipoprotein(a): Secondary | ICD-10-CM | POA: Diagnosis not present

## 2018-08-21 LAB — NMR, LIPOPROFILE
CHOLESTEROL, TOTAL: 108 mg/dL (ref 100–199)
HDL PARTICLE NUMBER: 30.4 umol/L — AB (ref >=30.5)
HDL-C: 37 mg/dL — ABNORMAL LOW (ref >39)
LDL Particle Number: 818 nmol/L (ref <1000)
LDL SIZE: 19.8 nm — AB (ref >20.5)
LDL-C: 56 mg/dL (ref 0–99)
LP-IR SCORE: 56 — AB (ref <=45)
SMALL LDL PARTICLE NUMBER: 620 nmol/L — AB (ref <=527)
TRIGLYCERIDES: 73 mg/dL (ref 0–149)

## 2018-08-21 MED ORDER — EZETIMIBE 10 MG PO TABS: 10.0000 mg | ORAL_TABLET | Freq: Every day | ORAL | 3 refills | Status: DC

## 2018-08-21 NOTE — Patient Instructions (Signed)
Medication Instructions:  Continue current medications Your zetia has been refilled If you need a refill on your cardiac medications before your next appointment, please call your pharmacy.   Lab work: FASTING lab work prior to next visit with Dr. Rennis Golden in May 2020 If you have labs (blood work) drawn today and your tests are completely normal, you will receive your results only by: Marland Kitchen MyChart Message (if you have MyChart) OR . A paper copy in the mail If you have any lab test that is abnormal or we need to change your treatment, we will call you to review the results.   Follow-Up: At [facility], you and your health needs are our priority.  As part of our continuing mission to provide you with exceptional heart care, we have created designated Provider Care Teams.  These Care Teams include your primary Cardiologist (physician) and Advanced Practice Providers (APPs -  Physician Assistants and Nurse Practitioners) who all work together to provide you with the care you need, when you need it. . You will need a follow up appointment in 6 months with Dr. Rennis Golden for LIPID CLINIC.  Please call our office 2 months in advance to schedule this appointment.

## 2018-08-21 NOTE — Progress Notes (Signed)
LIPID CLINIC CONSULT NOTE  Chief Complaint:  Follow-up dyslipidemia  Primary Care Physician: Shirline Frees, NP  HPI:  Pamela Hall is a 32 y.o. female who is being seen today for the evaluation of the meal hyperlipidemia at the request of Shirline Frees, NP.  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  She was under the care of Tyrone Apple, MD, who had noted on routine physical that she had markedly elevated cholesterol in 2017 during a routine physical exam.  Labs at the time showed total cholesterol of 234, triglycerides 231, HDL 34 and direct LDL 158.  Non-HDL cholesterol 200.  She had not previously had an assessment.  She was started on pravastatin 20mg  daily but developed myalgias with this.  She was then decreased to every other day dosing and still had problems with the medication.  Then it was discontinued.  She was started on ezetimibe 10 mg daily and has maintained on that.  She had a repeat lipid profile about 7 months ago which showed total cholesterol 221, triglycerides 147, HDL 38 and LDL-C of 154.  She reports a strong family history of premature coronary artery disease, including her grandmother who had diabetes and coronary artery disease starting in her 65s and her mother who had an MI when her 46s.  She also has a brother who she does not think is had cholesterol testing, and an 51-year-old son and 27-year-old daughter.  They have not had cholesterol testing.  She has no known coronary events.  Past medical history also significant for GERD and she uses a small amount of Valium for anxiety as well as Valtrex as needed.  Reports being physically active and exercises regularly without symptoms.  She reports her diet could be better, and generally consist of low saturated fats, and attempt to lower carbohydrates, but she reports being a "stress  eater" and does like sweets.  05/23/2018  Pamela Hall returns today for follow-up of her dyslipidemia.  Unfortunately there was a mixup on her ordering and her Crestor was sent to the wrong pharmacy.  We were not notified that she did not receive the medication and therefore we would not expect her numbers today to be any different than they were previously.  Overall she seems to be feeling pretty well.  She did meet on the interim with Dr.Joseph who felt that there is reasonable possibility she has an inherited dyslipidemia.  Blood work was sent to evaluate for FH.  She has continue to work on dietary changes.  I encouraged her to continue to reduce saturated fats in her diet and try to work on lower carbohydrates and weight loss.  08/21/2018  Pamela Hall is seen today in follow-up.  Overall she is doing well.  She has been taking rosuvastatin 20 mg daily in addition to ezetimibe without issue.  He did have a repeat lipoprotein NMR drawn yesterday however will take 2 to 3 days to result this.  She seems to be tolerating the medication and I am interested to see if she reaches her target LDL.  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  PMHx:  Past Medical History:  Diagnosis Date  . Abnormal Pap smear 2008   COLPO;WAS NORMAL;LAST PAP 10/2011  .  High cholesterol     Past Surgical History:  Procedure Laterality Date  . TUBAL LIGATION  09/30/2012   Procedure: POST PARTUM TUBAL LIGATION;  Surgeon: Michael Litter, MD;  Location: [facility];  Service: Gynecology;  Laterality: Bilateral;  . WISDOM TOOTH EXTRACTION  2003   ALL 4 EXTRACTED    FAMHx:  Family History  Problem Relation Age of Onset  . Diabetes type II Maternal Grandmother   . Hypertension Maternal Grandmother   . Heart disease Maternal Grandmother   . Anesthesia problems Maternal Grandmother         ALLERGIC TO GENERAL ANESTHESIA  . Other Maternal Grandmother   . Breast cancer Maternal Grandmother   . Schizophrenia Mother   . Bipolar disorder Mother   . Heart disease Mother   . Heart attack Mother     SOCHx:   reports that she has never smoked. She has never used smokeless tobacco. She reports that she does not drink alcohol or use drugs.  ALLERGIES:  Allergies  Allergen Reactions  . Statins     ROS: Pertinent items noted in HPI and remainder of comprehensive ROS otherwise negative.  HOME MEDS: Current Outpatient Medications on File Prior to Visit  Medication Sig Dispense Refill  . diazepam (VALIUM) 5 MG tablet Take 5 mg by mouth as needed for anxiety.    Marland Kitchen ezetimibe (ZETIA) 10 MG tablet Take 1 tablet (10 mg total) by mouth daily. **DUE FOR YEARLY PHYSICAL WITH [facility]** 30 tablet 0  . pantoprazole (PROTONIX) 40 MG tablet Take 1 tablet (40 mg total) by mouth daily. 90 tablet 1  . rosuvastatin (CRESTOR) 20 MG tablet Take 1 tablet (20 mg total) by mouth daily. 90 tablet 3  . valACYclovir (VALTREX) 500 MG tablet Take 1 tablet (500 mg total) by mouth as needed. 90 tablet 3   No current facility-administered medications on file prior to visit.     LABS/IMAGING: No results found for this or any previous visit (from the past 48 hour(s)). No results found.  LIPID PANEL:    Component Value Date/Time   CHOL 221 (H) 07/30/2017 0856   TRIG 147.0 07/30/2017 0856   HDL 37.80 (L) 07/30/2017 0856   CHOLHDL 6 07/30/2017 0856   VLDL 29.4 07/30/2017 0856   LDLCALC 154 (H) 07/30/2017 0856    WEIGHTS: Wt Readings from Last 3 Encounters:  08/21/18 245 lb (111.1 kg)  05/23/18 242 lb 12.8 oz (110.1 kg)  02/26/18 242 lb (109.8 kg)    VITALS: BP 108/71   Pulse 66   Ht 5' 7.5" (1.715 m)   Wt 245 lb (111.1 kg)   BMI 37.81 kg/m   EXAM: Deferred  EKG: Deferred  ASSESSMENT: 1. Possible FH - low Dutch score of 2 2. 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   Pamela Hall does have possible FH  and underwent genetic testing.  Fortunately, her genetic testing was negative although does not completely rule out inherited dyslipidemia, her inheritance pattern in the family is not suggestive of FH.  He is currently tolerating high intensity statin and ezetimibe.  She is unlikely though to reach target reduction of LDL cholesterol and has elevated LPA as well and is a good candidate for PCSK9 inhibitor.  Ultimately she may be a candidate for SI-RNA therapy to LPA which is in development.  Plan follow-up in 3 to 6 months and will contact her with her lab results when we receive them and adjust medications accordingly.  Chrystie Nose, MD, [facility], FACP  [place]  [facility]  Medical Director of the Advanced Lipid Disorders &  Cardiovascular Risk Reduction Clinic Diplomate of the American Board of Clinical Lipidology Attending Cardiologist  Direct Dial: [phone]  Fax: [phone]  Website:  [email or web address]  08/21/2018, 8:53 AM

## 2018-11-19 ENCOUNTER — Ambulatory Visit (INDEPENDENT_AMBULATORY_CARE_PROVIDER_SITE_OTHER): Payer: No Typology Code available for payment source | Admitting: Adult Health

## 2018-11-19 ENCOUNTER — Encounter: Payer: Self-pay | Admitting: Adult Health

## 2018-11-19 VITALS — BP 108/72 | Temp 98.2°F | Ht 66.5 in | Wt 245.0 lb

## 2018-11-19 DIAGNOSIS — E7801 Familial hypercholesterolemia: Secondary | ICD-10-CM

## 2018-11-19 DIAGNOSIS — J0140 Acute pansinusitis, unspecified: Secondary | ICD-10-CM | POA: Diagnosis not present

## 2018-11-19 DIAGNOSIS — Z0000 Encounter for general adult medical examination without abnormal findings: Secondary | ICD-10-CM | POA: Diagnosis not present

## 2018-11-19 LAB — COMPREHENSIVE METABOLIC PANEL
ALBUMIN: 4.1 g/dL (ref 3.5–5.2)
ALT: 20 U/L (ref 0–35)
AST: 15 U/L (ref 0–37)
Alkaline Phosphatase: 71 U/L (ref 39–117)
BILIRUBIN TOTAL: 0.4 mg/dL (ref 0.2–1.2)
BUN: 10 mg/dL (ref 6–23)
CALCIUM: 9.1 mg/dL (ref 8.4–10.5)
CHLORIDE: 105 meq/L (ref 96–112)
CO2: 24 mEq/L (ref 19–32)
CREATININE: 0.73 mg/dL (ref 0.40–1.20)
GFR: 110.31 mL/min (ref >60.00)
Glucose, Bld: 80 mg/dL (ref 70–99)
Potassium: 4.2 mEq/L (ref 3.5–5.1)
Sodium: 140 mEq/L (ref 135–145)
TOTAL PROTEIN: 7 g/dL (ref 6.0–8.3)

## 2018-11-19 LAB — CBC WITH DIFFERENTIAL/PLATELET
Basophils Absolute: 0 10*3/uL (ref 0.0–0.1)
Basophils Relative: 0.5 % (ref 0.0–3.0)
Eosinophils Absolute: 0.1 10*3/uL (ref 0.0–0.7)
Eosinophils Relative: 1.4 % (ref 0.0–5.0)
HEMATOCRIT: 41.5 % (ref 36.0–46.0)
Hemoglobin: 14.1 g/dL (ref 12.0–15.0)
LYMPHS PCT: 34.1 % (ref 12.0–46.0)
Lymphs Abs: 2.4 10*3/uL (ref 0.7–4.0)
MCHC: 33.9 g/dL (ref 30.0–36.0)
MCV: 81.5 fl (ref 78.0–100.0)
MONOS PCT: 9.2 % (ref 3.0–12.0)
Monocytes Absolute: 0.7 10*3/uL (ref 0.1–1.0)
NEUTROS ABS: 3.9 10*3/uL (ref 1.4–7.7)
Neutrophils Relative %: 54.8 % (ref 43.0–77.0)
PLATELETS: 210 10*3/uL (ref 150.0–400.0)
RBC: 5.1 Mil/uL (ref 3.87–5.11)
RDW: 14.1 % (ref 11.5–15.5)
WBC: 7.2 10*3/uL (ref 4.0–10.5)

## 2018-11-19 LAB — TSH: TSH: 0.73 u[IU]/mL (ref 0.35–4.50)

## 2018-11-19 MED ORDER — DOXYCYCLINE HYCLATE 100 MG PO CAPS: 100.0000 mg | ORAL_CAPSULE | Freq: Two times a day (BID) | ORAL | 0 refills | Status: DC

## 2018-11-19 NOTE — Progress Notes (Signed)
Subjective:    Patient ID: Pamela Hall, female    DOB: 05/31/1986, 33 y.o.   MRN: [id number]  HPI Patient presents for yearly preventative medicine examination. Pleasant 33 year old female who  has a past medical history of Abnormal Pap smear (2008) and High cholesterol.  Familial Hyperlipidemia -is currently followed by the lipid clinic.  Prescribed medications are Crestor 20 mg and Zetia 10 mg.  She reports tolerating this medication well and her cholesterol panel has improved significantly.   Anxiety - takes Valium as needed  Her only acute concern today is that of sinusitis like symptoms for the last month and a half. She has been using various OTC medications without resolution    All immunizations and health maintenance protocols were reviewed with the patient and needed orders were placed. utd on vaccinations  Appropriate screening laboratory values were ordered for the patient including screening of hyperlipidemia, renal function and hepatic function. If indicated by BPH, a PSA was ordered.  Medication reconciliation,  past medical history, social history, problem list and allergies were reviewed in detail with the patient  Goals were established with regard to weight loss, exercise, and  diet in compliance with medications. She has been eating healthy but not exercising on a routine basis   She is up to date on GYN exam    Review of Systems  Constitutional: Positive for fatigue.  HENT: Positive for postnasal drip, rhinorrhea, sinus pressure and sinus pain.   Eyes: Negative.   Respiratory: Negative.   Cardiovascular: Negative.   Gastrointestinal: Negative.   Endocrine: Negative.   Genitourinary: Negative.   Musculoskeletal: Negative.   Skin: Negative.   Allergic/Immunologic: Negative.   Neurological: Negative.   Hematological: Negative.   Psychiatric/Behavioral: Negative.    Past Medical History:  Diagnosis Date  . Abnormal Pap smear 2008   COLPO;WAS NORMAL;LAST PAP 10/2011  . High cholesterol     Social History   Socioeconomic History  . Marital status: Married    Spouse name: Leeanne RioNTHONY JOHNSON  . Number of children: 1  . Years of education: 6717  . Highest education level: Not on file  Occupational History  . Occupation: NP     Employer: [employer]  Social Needs  . Financial resource strain: Not on file  . Food insecurity:    Worry: Not on file    Inability: Not on file  . Transportation needs:    Medical: Not on file    Non-medical: Not on file  Tobacco Use  . Smoking status: Never Smoker  . Smokeless tobacco: Never Used  Substance and Sexual Activity  . Alcohol use: No    Alcohol/week: 1.0 - 2.0 standard drinks    Types: 1 - 2 Glasses of wine per week    Comment: SOCIAL DRINKER  . Drug use: No  . Sexual activity: Not Currently    Partners: Male    Birth control/protection: Surgical  Lifestyle  . Physical activity:    Days per week: Not on file    Minutes per session: Not on file  . Stress: Not on file  Relationships  . Social connections:    Talks on phone: Not on file    Gets together: Not on file    Attends religious service: Not on file    Active member of club or organization: Not on file    Attends meetings of clubs or organizations: Not on file    Relationship status: Not on file  .  Intimate partner violence:    Fear of current or ex partner: Not on file    Emotionally abused: Not on file    Physically abused: Not on file    Forced sexual activity: Not on file  Other Topics Concern  . Not on file  Social History Narrative  . Not on file    Past Surgical History:  Procedure Laterality Date  . TUBAL LIGATION  09/30/2012   Procedure: POST PARTUM TUBAL LIGATION;  Surgeon: Michael Litter, MD;  Location: [facility];  Service: Gynecology;  Laterality: Bilateral;  . WISDOM TOOTH EXTRACTION  2003   ALL 4 EXTRACTED    Family History  Problem Relation Age of Onset  . Diabetes type II Maternal  Grandmother   . Hypertension Maternal Grandmother   . Heart disease Maternal Grandmother   . Anesthesia problems Maternal Grandmother        ALLERGIC TO GENERAL ANESTHESIA  . Other Maternal Grandmother   . Breast cancer Maternal Grandmother   . Schizophrenia Mother   . Bipolar disorder Mother   . Heart disease Mother   . Heart attack Mother     Allergies  Allergen Reactions  . Statins     Current Outpatient Medications on File Prior to Visit  Medication Sig Dispense Refill  . diazepam (VALIUM) 5 MG tablet Take 5 mg by mouth as needed for anxiety.    Marland Kitchen ezetimibe (ZETIA) 10 MG tablet Take 1 tablet (10 mg total) by mouth daily. 90 tablet 3  . pantoprazole (PROTONIX) 40 MG tablet Take 1 tablet (40 mg total) by mouth daily. 90 tablet 1  . valACYclovir (VALTREX) 500 MG tablet Take 1 tablet (500 mg total) by mouth as needed. 90 tablet 3  . rosuvastatin (CRESTOR) 20 MG tablet Take 1 tablet (20 mg total) by mouth daily. 90 tablet 3   No current facility-administered medications on file prior to visit.     BP 108/72   Temp 98.2 F (36.8 C)   Ht 5' 6.5" (1.689 m)   Wt 245 lb (111.1 kg)   BMI 38.95 kg/m       Objective:   Physical Exam Vitals signs and nursing note reviewed.  Constitutional:      General: She is not in acute distress.    Appearance: Normal appearance. She is well-developed. She is obese.  HENT:     Head: Normocephalic and atraumatic.     Right Ear: Tympanic membrane, ear canal and external ear normal. There is no impacted cerumen.     Left Ear: Tympanic membrane, ear canal and external ear normal. There is no impacted cerumen.     Nose: No congestion or rhinorrhea.     Right Turbinates: Enlarged and swollen.     Left Turbinates: Enlarged and swollen.     Right Sinus: Maxillary sinus tenderness and frontal sinus tenderness present.     Left Sinus: Maxillary sinus tenderness and frontal sinus tenderness present.     Mouth/Throat:     Mouth: Mucous membranes  are moist.     Pharynx: Oropharynx is clear. No oropharyngeal exudate.  Eyes:     General:        Right eye: No discharge.        Left eye: No discharge.     Extraocular Movements: Extraocular movements intact.     Conjunctiva/sclera: Conjunctivae normal.     Pupils: Pupils are equal, round, and reactive to light.  Neck:     Musculoskeletal: Normal range of  motion and neck supple. No neck rigidity or muscular tenderness.     Thyroid: No thyromegaly.     Vascular: No carotid bruit.     Trachea: No tracheal deviation.  Cardiovascular:     Rate and Rhythm: Normal rate and regular rhythm.     Heart sounds: Normal heart sounds. No murmur. No friction rub. No gallop.   Pulmonary:     Effort: Pulmonary effort is normal. No respiratory distress.     Breath sounds: Normal breath sounds. No stridor. No wheezing, rhonchi or rales.  Chest:     Chest wall: No tenderness.  Abdominal:     General: Bowel sounds are normal. There is no distension.     Palpations: Abdomen is soft. There is no mass.     Tenderness: There is no abdominal tenderness. There is no right CVA tenderness, left CVA tenderness, guarding or rebound.     Hernia: No hernia is present.  Musculoskeletal: Normal range of motion.        General: No swelling, tenderness, deformity or signs of injury.     Right lower leg: No edema.     Left lower leg: No edema.  Lymphadenopathy:     Cervical: No cervical adenopathy.  Skin:    General: Skin is warm and dry.     Coloration: Skin is not jaundiced or pale.     Findings: No bruising, erythema, lesion or rash.  Neurological:     General: No focal deficit present.     Mental Status: She is alert and oriented to person, place, and time. Mental status is at baseline.     Cranial Nerves: No cranial nerve deficit.     Sensory: No sensory deficit.     Motor: No weakness.     Coordination: Coordination normal.     Gait: Gait normal.     Deep Tendon Reflexes: Reflexes normal.    Psychiatric:        Mood and Affect: Mood normal.        Behavior: Behavior normal.        Thought Content: Thought content normal.        Judgment: Judgment normal.       Assessment & Plan:  1. Routine general medical examination at a health care facility - Encouraged weight loss through diet and exercise - Follow up in one year or sooner if needed - CBC with Differential/Platelet - Comprehensive metabolic panel - TSH  2. Familial hypercholesterolemia - Continue with follow up with lipid clinic  - CBC with Differential/Platelet - Comprehensive metabolic panel - TSH  3. Acute non-recurrent pansinusitis  - doxycycline (VIBRAMYCIN) 100 MG capsule; Take 1 capsule (100 mg total) by mouth 2 (two) times daily.  Dispense: 14 capsule; Refill: 0  [employer]

## 2019-03-02 ENCOUNTER — Encounter: Payer: Self-pay | Admitting: Family Medicine

## 2019-03-02 ENCOUNTER — Ambulatory Visit (INDEPENDENT_AMBULATORY_CARE_PROVIDER_SITE_OTHER): Payer: No Typology Code available for payment source | Admitting: Family Medicine

## 2019-03-02 ENCOUNTER — Other Ambulatory Visit: Payer: Self-pay

## 2019-03-02 DIAGNOSIS — M25552 Pain in left hip: Secondary | ICD-10-CM | POA: Diagnosis not present

## 2019-03-02 DIAGNOSIS — M7062 Trochanteric bursitis, left hip: Secondary | ICD-10-CM | POA: Diagnosis not present

## 2019-03-02 MED ORDER — CELECOXIB 100 MG PO CAPS: 100.0000 mg | ORAL_CAPSULE | Freq: Two times a day (BID) | ORAL | 0 refills | Status: AC

## 2019-03-02 NOTE — Progress Notes (Signed)
Virtual Visit via Video Note   I connected with Pamela Hall on 03/02/19 at  2:30 PM EDT by a video enabled telemedicine application and verified that I am speaking with the correct person using two identifiers.  Location patient: home Location provider:home office Persons participating in the virtual visit: patient, provider  I discussed the limitations of evaluation and management by telemedicine and the availability of in person appointments. The patient expressed understanding and agreed to proceed.   HPI: She is a 33 yo AA female with Hx of anxiety and obesity among some c/o a week of left hip pain, lateral aspect, moderate associated dull/achy and sometimes sharp pain. Pain radiates to lateral aspect of left knee, denies numbness or tingling. She has not noted LLE edema,erythema,or weakness.  States that for a while she has had left hip pain but not like the pain she has now. Intermittent mild pain that happens usually after long drive.  Occasionally she has back pain but has not had any lately.  Pain is exacerbated by laying on left side and when changing from sitting to standing position. It seems to be alleviated by walking. Stable.  No history of trauma. Negative for fever, chills, fatigue, body aches, edema/erythema of affected joint. She has tried OTC Aleve, Flexeril, and topical medications; all these give temporal relief.  ROS: See pertinent positives and negatives per HPI.  Past Medical History:  Diagnosis Date  . Abnormal Pap smear 2008   COLPO;WAS NORMAL;LAST PAP 10/2011  . High cholesterol     Past Surgical History:  Procedure Laterality Date  . TUBAL LIGATION  09/30/2012   Procedure: POST PARTUM TUBAL LIGATION;  Surgeon: Michael Litter, MD;  Location: [facility];  Service: Gynecology;  Laterality: Bilateral;  . WISDOM TOOTH EXTRACTION  2003   ALL 4 EXTRACTED    Family History  Problem Relation Age of Onset  . Diabetes type II Maternal Grandmother   .  Hypertension Maternal Grandmother   . Heart disease Maternal Grandmother   . Anesthesia problems Maternal Grandmother        ALLERGIC TO GENERAL ANESTHESIA  . Other Maternal Grandmother   . Breast cancer Maternal Grandmother   . Schizophrenia Mother   . Bipolar disorder Mother   . Heart disease Mother   . Heart attack Mother     Social History   Socioeconomic History  . Marital status: Married    Spouse name: Leeanne Rio  . Number of children: 1  . Years of education: 47  . Highest education level: Not on file  Occupational History  . Occupation: NP     Employer: [employer]  Social Needs  . Financial resource strain: Not on file  . Food insecurity:    Worry: Not on file    Inability: Not on file  . Transportation needs:    Medical: Not on file    Non-medical: Not on file  Tobacco Use  . Smoking status: Never Smoker  . Smokeless tobacco: Never Used  Substance and Sexual Activity  . Alcohol use: No    Alcohol/week: 1.0 - 2.0 standard drinks    Types: 1 - 2 Glasses of wine per week    Comment: SOCIAL DRINKER  . Drug use: No  . Sexual activity: Not Currently    Partners: Male    Birth control/protection: Surgical  Lifestyle  . Physical activity:    Days per week: Not on file    Minutes per session: Not on file  .  Stress: Not on file  Relationships  . Social connections:    Talks on phone: Not on file    Gets together: Not on file    Attends religious service: Not on file    Active member of club or organization: Not on file    Attends meetings of clubs or organizations: Not on file    Relationship status: Not on file  . Intimate partner violence:    Fear of current or ex partner: Not on file    Emotionally abused: Not on file    Physically abused: Not on file    Forced sexual activity: Not on file  Other Topics Concern  . Not on file  Social History Narrative  . Not on file      Current Outpatient Medications:  .  celecoxib (CELEBREX) 100 MG  capsule, Take 1 capsule (100 mg total) by mouth 2 (two) times daily for 10 days., Disp: 20 capsule, Rfl: 0 .  diazepam (VALIUM) 5 MG tablet, Take 5 mg by mouth as needed for anxiety., Disp: , Rfl:  .  ezetimibe (ZETIA) 10 MG tablet, Take 1 tablet (10 mg total) by mouth daily., Disp: 90 tablet, Rfl: 3 .  pantoprazole (PROTONIX) 40 MG tablet, Take 1 tablet (40 mg total) by mouth daily., Disp: 90 tablet, Rfl: 1 .  rosuvastatin (CRESTOR) 20 MG tablet, Take 1 tablet (20 mg total) by mouth daily., Disp: 90 tablet, Rfl: 3 .  valACYclovir (VALTREX) 500 MG tablet, Take 1 tablet (500 mg total) by mouth as needed., Disp: 90 tablet, Rfl: 3  EXAM:  VITALS per patient if applicable:N/A  GENERAL: alert, oriented, appears well and in no acute distress  HEENT: atraumatic,normocephalic, conjunttiva clear, no facial obvious abnormalities on inspection.  LUNGS: on inspection no signs of respiratory distress, breathing rate appears normal, no obvious gross SOB, gasping or wheezing  CV: no obvious cyanosis  Pamela: moves all visible extremities without noticeable abnormality  PSYCH/NEURO: pleasant and cooperative, no obvious depression or anxiety, speech and thought processing grossly intact  ASSESSMENT AND PLAN:  Discussed the following assessment and plan:  Left hip pain - Plan: celecoxib (CELEBREX) 100 MG capsule, DG Hip Unilat W OR W/O Pelvis 2-3 Views Left We discussed possible causes of hip pain, ?radicular pain,bursitis,OA among some. She would like to have imaging done. Hx suggest trochanteric bursitis.  Trochanteric bursitis of left hip Treatment options discussed. Celebrex side effects discussed,recommend taking it bid for 7-10 days. Local ice. Instructed about warning signs. F/U with PCP as needed.   I discussed the assessment and treatment plan with the patient. She was provided an opportunity to ask questions and all were answered. The patient agreed with the plan and demonstrated an  understanding of the instructions.   The patient was advised to call back or seek an in-person evaluation if the symptoms worsen or if the condition fails to improve as anticipated.  Return if symptoms worsen or fail to improve.    Yeimi Debnam Swazil[place], MD

## 2019-03-03 ENCOUNTER — Ambulatory Visit (INDEPENDENT_AMBULATORY_CARE_PROVIDER_SITE_OTHER): Payer: No Typology Code available for payment source

## 2019-03-03 DIAGNOSIS — M25552 Pain in left hip: Secondary | ICD-10-CM

## 2019-03-04 ENCOUNTER — Encounter: Payer: Self-pay | Admitting: Family Medicine

## 2019-03-11 ENCOUNTER — Telehealth: Payer: Self-pay | Admitting: *Deleted

## 2019-03-11 NOTE — Telephone Encounter (Signed)
Unable to leave a message, no voicemail.  

## 2019-04-15 ENCOUNTER — Telehealth: Payer: Self-pay | Admitting: Adult Health

## 2019-04-15 NOTE — Telephone Encounter (Signed)
[facility] Physical Form to be filled out;placed in dr's folder.   Mail form to: [address] [place], [place] [address]

## 2019-04-29 NOTE — Telephone Encounter (Signed)
Form filled out and placed in Cory's box.

## 2019-04-30 ENCOUNTER — Encounter: Payer: Self-pay | Admitting: Family Medicine

## 2019-04-30 NOTE — Telephone Encounter (Signed)
Per Tommi Rumps, pt will need an in office visit.  Message sent by MyChart.

## 2019-05-01 NOTE — Telephone Encounter (Signed)
Pt has viewed message.

## 2019-05-05 NOTE — Telephone Encounter (Signed)
Pt scheduled to see Tommi Rumps on 05/06/2019

## 2019-05-06 ENCOUNTER — Ambulatory Visit (INDEPENDENT_AMBULATORY_CARE_PROVIDER_SITE_OTHER): Payer: No Typology Code available for payment source | Admitting: Adult Health

## 2019-05-06 ENCOUNTER — Encounter: Payer: Self-pay | Admitting: Adult Health

## 2019-05-06 ENCOUNTER — Other Ambulatory Visit: Payer: Self-pay

## 2019-05-06 VITALS — BP 110/70 | Temp 98.9°F | Ht 66.75 in | Wt 247.0 lb

## 2019-05-06 DIAGNOSIS — Z020 Encounter for examination for admission to educational institution: Secondary | ICD-10-CM | POA: Diagnosis not present

## 2019-05-06 LAB — POCT URINALYSIS DIPSTICK
Bilirubin, UA: NEGATIVE
Blood, UA: NEGATIVE
Glucose, UA: NEGATIVE
Ketones, UA: NEGATIVE
Leukocytes, UA: NEGATIVE
Nitrite, UA: NEGATIVE
Odor: NEGATIVE
Protein, UA: NEGATIVE
Spec Grav, UA: 1.015 (ref 1.010–1.025)
Urobilinogen, UA: 1 E.U./dL
pH, UA: 8 (ref 5.0–8.0)

## 2019-05-06 LAB — CBC WITH DIFFERENTIAL/PLATELET
Basophils Absolute: 0 10*3/uL (ref 0.0–0.1)
Basophils Relative: 0.6 % (ref 0.0–3.0)
Eosinophils Absolute: 0.1 10*3/uL (ref 0.0–0.7)
Eosinophils Relative: 0.8 % (ref 0.0–5.0)
HCT: 41.7 % (ref 36.0–46.0)
Hemoglobin: 13.8 g/dL (ref 12.0–15.0)
Lymphocytes Relative: 34.2 % (ref 12.0–46.0)
Lymphs Abs: 2.6 10*3/uL (ref 0.7–4.0)
MCHC: 33.2 g/dL (ref 30.0–36.0)
MCV: 82 fl (ref 78.0–100.0)
Monocytes Absolute: 0.8 10*3/uL (ref 0.1–1.0)
Monocytes Relative: 11.1 % (ref 3.0–12.0)
Neutro Abs: 4 10*3/uL (ref 1.4–7.7)
Neutrophils Relative %: 53.3 % (ref 43.0–77.0)
Platelets: 229 10*3/uL (ref 150.0–400.0)
RBC: 5.08 Mil/uL (ref 3.87–5.11)
RDW: 13.6 % (ref 11.5–15.5)
WBC: 7.5 10*3/uL (ref 4.0–10.5)

## 2019-05-06 NOTE — Progress Notes (Signed)
Subjective:    Patient ID: Pamela Hall, female    DOB: 02/02/1986, 33 y.o.   MRN: [id number]  HPI  33 year old female who  has a past medical history of Abnormal Pap smear (2008) and High cholesterol.   She presents for school physical exam. She has applied and been excepted into the [employer] DNP Program for a post masters degree in Psychiatry. She needs a form filled out for the program.   Review of Systems  Constitutional: Negative.   HENT: Negative.   Eyes: Negative.   Respiratory: Negative.   Cardiovascular: Negative.   Gastrointestinal: Negative.   Endocrine: Negative.   Genitourinary: Negative.   Musculoskeletal: Negative.   Skin: Negative.   Allergic/Immunologic: Negative.   Neurological: Negative.   Hematological: Negative.   Psychiatric/Behavioral: Negative.    Past Medical History:  Diagnosis Date  . Abnormal Pap smear 2008   COLPO;WAS NORMAL;LAST PAP 10/2011  . High cholesterol     Social History   Socioeconomic History  . Marital status: Married    Spouse name: Leeanne RioNTHONY JOHNSON  . Number of children: 1  . Years of education: 1117  . Highest education level: Not on file  Occupational History  . Occupation: NP     Employer: [employer]  Social Needs  . Financial resource strain: Not on file  . Food insecurity    Worry: Not on file    Inability: Not on file  . Transportation needs    Medical: Not on file    Non-medical: Not on file  Tobacco Use  . Smoking status: Never Smoker  . Smokeless tobacco: Never Used  Substance and Sexual Activity  . Alcohol use: No    Alcohol/week: 1.0 - 2.0 standard drinks    Types: 1 - 2 Glasses of wine per week    Comment: SOCIAL DRINKER  . Drug use: No  . Sexual activity: Not Currently    Partners: Male    Birth control/protection: Surgical  Lifestyle  . Physical activity    Days per week: Not on file    Minutes per session: Not on file  . Stress: Not on file  Relationships  . Social  Musicianconnections    Talks on phone: Not on file    Gets together: Not on file    Attends religious service: Not on file    Active member of club or organization: Not on file    Attends meetings of clubs or organizations: Not on file    Relationship status: Not on file  . Intimate partner violence    Fear of current or ex partner: Not on file    Emotionally abused: Not on file    Physically abused: Not on file    Forced sexual activity: Not on file  Other Topics Concern  . Not on file  Social History Narrative  . Not on file    Past Surgical History:  Procedure Laterality Date  . TUBAL LIGATION  09/30/2012   Procedure: POST PARTUM TUBAL LIGATION;  Surgeon: Michael LitterNaima A Dillard, MD;  Location: [facility];  Service: Gynecology;  Laterality: Bilateral;  . WISDOM TOOTH EXTRACTION  2003   ALL 4 EXTRACTED    Family History  Problem Relation Age of Onset  . Diabetes type II Maternal Grandmother   . Hypertension Maternal Grandmother   . Heart disease Maternal Grandmother   . Anesthesia problems Maternal Grandmother        ALLERGIC TO GENERAL ANESTHESIA  . Other  Maternal Grandmother   . Breast cancer Maternal Grandmother   . Schizophrenia Mother   . Bipolar disorder Mother   . Heart disease Mother   . Heart attack Mother     Allergies  Allergen Reactions  . Statins     Current Outpatient Medications on File Prior to Visit  Medication Sig Dispense Refill  . diazepam (VALIUM) 5 MG tablet Take 5 mg by mouth as needed for anxiety.    Marland Kitchen ezetimibe (ZETIA) 10 MG tablet Take 1 tablet (10 mg total) by mouth daily. 90 tablet 3  . NON FORMULARY Ashwaganda - For Stress and Anxiety    . pantoprazole (PROTONIX) 40 MG tablet Take 1 tablet (40 mg total) by mouth daily. 90 tablet 1  . valACYclovir (VALTREX) 500 MG tablet Take 1 tablet (500 mg total) by mouth as needed. 90 tablet 3  . rosuvastatin (CRESTOR) 20 MG tablet Take 1 tablet (20 mg total) by mouth daily. 90 tablet 3   No current  facility-administered medications on file prior to visit.     BP 110/70   Temp 98.9 F (37.2 C)   Ht 5' 6.75" (1.695 m)   Wt 247 lb (112 kg)   BMI 38.98 kg/m       Objective:   Physical Exam Vitals signs and nursing note reviewed.  Constitutional:      General: She is not in acute distress.    Appearance: Normal appearance. She is well-developed. She is obese.  HENT:     Head: Normocephalic and atraumatic.     Right Ear: Tympanic membrane, ear canal and external ear normal. There is no impacted cerumen.     Left Ear: Tympanic membrane and external ear normal. There is no impacted cerumen.     Nose: Nose normal.     Mouth/Throat:     Pharynx: No oropharyngeal exudate.  Eyes:     General:        Right eye: No discharge.        Left eye: No discharge.     Conjunctiva/sclera: Conjunctivae normal.  Neck:     Musculoskeletal: Normal range of motion and neck supple.     Thyroid: No thyromegaly.     Trachea: No tracheal deviation.  Cardiovascular:     Rate and Rhythm: Normal rate and regular rhythm.     Pulses: Normal pulses.     Heart sounds: Normal heart sounds. No murmur. No friction rub. No gallop.   Pulmonary:     Effort: Pulmonary effort is normal. No respiratory distress.     Breath sounds: Normal breath sounds. No wheezing or rales.  Chest:     Chest wall: No tenderness.  Abdominal:     General: Abdomen is flat. Bowel sounds are normal. There is no distension.     Palpations: Abdomen is soft. There is no mass.     Tenderness: There is no abdominal tenderness. There is no right CVA tenderness, left CVA tenderness, guarding or rebound.     Hernia: No hernia is present.  Musculoskeletal: Normal range of motion.  Lymphadenopathy:     Cervical: No cervical adenopathy.  Skin:    General: Skin is warm and dry.     Coloration: Skin is not pale.     Findings: No erythema or rash.  Neurological:     General: No focal deficit present.     Mental Status: She is alert and  oriented to person, place, and time. Mental status is at baseline.  Cranial Nerves: No cranial nerve deficit.     Coordination: Coordination normal.  Psychiatric:        Mood and Affect: Mood normal.        Behavior: Behavior normal.        Thought Content: Thought content normal.        Judgment: Judgment normal.        Assessment & Plan:  1. Encounter for school history and physical examination - Will fill out Physical Exam Form for [employer] and mail to patient  - POC Urinalysis Dipstick - CBC with Differential/Platelet - Measles/Mumps/Rubella Immunity - Varicella Zoster Antibody, IgG - QuantiFERON-TB Gold Plus - Hep B Surface Antibody   Shirline Freesory Abishai Viegas, NP

## 2019-05-08 LAB — MEASLES/MUMPS/RUBELLA IMMUNITY
Mumps IgG: 130 AU/mL
Rubella: 2.47 index
Rubeola IgG: >300 AU/mL

## 2019-05-08 LAB — QUANTIFERON-TB GOLD PLUS
Mitogen-NIL: 7.04 IU/mL
NIL: 0.02 IU/mL
QuantiFERON-TB Gold Plus: NEGATIVE
TB1-NIL: 0 IU/mL
TB2-NIL: 0 IU/mL

## 2019-05-08 LAB — HEPATITIS B SURFACE ANTIBODY,QUALITATIVE: Hep B S Ab: NONREACTIVE

## 2019-05-08 LAB — VARICELLA ZOSTER ANTIBODY, IGG: Varicella IgG: >4000 index

## 2019-05-18 ENCOUNTER — Encounter: Payer: Self-pay | Admitting: Adult Health

## 2019-06-15 ENCOUNTER — Other Ambulatory Visit: Payer: Self-pay

## 2019-06-15 ENCOUNTER — Encounter: Payer: Self-pay | Admitting: Adult Health

## 2019-06-15 ENCOUNTER — Other Ambulatory Visit: Payer: Self-pay | Admitting: Internal Medicine

## 2019-06-15 DIAGNOSIS — E7801 Familial hypercholesterolemia: Secondary | ICD-10-CM

## 2019-06-15 MED ORDER — ROSUVASTATIN CALCIUM 20 MG PO TABS: 20.0000 mg | ORAL_TABLET | Freq: Every day | ORAL | 0 refills | Status: DC

## 2019-06-17 MED ORDER — VALACYCLOVIR HCL 500 MG PO TABS: 500.0000 mg | ORAL_TABLET | ORAL | 3 refills | Status: DC | PRN

## 2019-06-24 ENCOUNTER — Telehealth: Payer: Self-pay | Admitting: Family Medicine

## 2019-06-24 MED ORDER — VALACYCLOVIR HCL 500 MG PO TABS: ORAL_TABLET | ORAL | 3 refills | Status: DC

## 2019-06-24 NOTE — Telephone Encounter (Signed)
Sent to the pharmacy as directed by Dorothyann Peng, AGNP-C

## 2019-06-24 NOTE — Telephone Encounter (Signed)
Received fax that directions for Valtrex were not written correctly.  Rx must be resent with directions of once daily, twice daily etc..Marland KitchenMarland KitchenPlease resend Rx.

## 2019-07-10 ENCOUNTER — Other Ambulatory Visit: Payer: Self-pay | Admitting: Internal Medicine

## 2019-07-10 DIAGNOSIS — E7801 Familial hypercholesterolemia: Secondary | ICD-10-CM

## 2019-07-15 NOTE — Telephone Encounter (Signed)
done

## 2019-10-02 ENCOUNTER — Other Ambulatory Visit: Payer: Self-pay | Admitting: Internal Medicine

## 2019-10-05 NOTE — Telephone Encounter (Signed)
Rx(s) sent to pharmacy electronically.  

## 2019-10-22 ENCOUNTER — Other Ambulatory Visit: Payer: Self-pay | Admitting: Internal Medicine

## 2019-10-22 DIAGNOSIS — E7801 Familial hypercholesterolemia: Secondary | ICD-10-CM

## 2019-11-04 ENCOUNTER — Telehealth: Payer: Self-pay | Admitting: Internal Medicine

## 2019-11-04 ENCOUNTER — Other Ambulatory Visit: Payer: Self-pay | Admitting: Internal Medicine

## 2019-11-04 DIAGNOSIS — E7801 Familial hypercholesterolemia: Secondary | ICD-10-CM

## 2019-11-04 DIAGNOSIS — E7841 Elevated Lipoprotein(a): Secondary | ICD-10-CM

## 2019-11-04 NOTE — Telephone Encounter (Signed)
Yes - she needs a lipid NMR prior to visit.  Dr Rexene Edison

## 2019-11-04 NOTE — Telephone Encounter (Signed)
Attempted to contact pt.  Left message to call back.  

## 2019-11-04 NOTE — Telephone Encounter (Signed)
Patient is scheduled for an overdue office visit with Dr. Rennis Golden on 12/21/19. She wants to know if she needs to have a lipid panel done before then. Please advise.

## 2019-11-04 NOTE — Telephone Encounter (Signed)
*  STAT* If patient is at the pharmacy, call can be transferred to refill team.   1. Which medications need to be refilled? (please list name of each medication and dose if known) ezetimibe (ZETIA) 10 MG tablet / rosuvastatin (CRESTOR) 20 MG tablet  2. Which pharmacy/location (including street and city if local pharmacy) is medication to be sent to? [id number] - [place], [place] - [address]  3. Do they need a 30 day or 90 day supply? 90  Patient has appt with Dr. Rennis Golden on 12/21/19

## 2019-11-05 NOTE — Telephone Encounter (Signed)
NMR, lipoprofile ordered.   Patient notified  Lab order mailed

## 2019-11-06 MED ORDER — EZETIMIBE 10 MG PO TABS: 10.0000 mg | ORAL_TABLET | Freq: Every day | ORAL | 0 refills | Status: DC

## 2019-11-06 MED ORDER — ROSUVASTATIN CALCIUM 20 MG PO TABS: 20.0000 mg | ORAL_TABLET | Freq: Every day | ORAL | 0 refills | Status: DC

## 2019-11-06 NOTE — Telephone Encounter (Signed)
Rx request sent to pharmacy.  

## 2019-11-09 ENCOUNTER — Other Ambulatory Visit: Payer: Self-pay | Admitting: Internal Medicine

## 2019-11-09 DIAGNOSIS — E7801 Familial hypercholesterolemia: Secondary | ICD-10-CM

## 2019-11-09 MED ORDER — ROSUVASTATIN CALCIUM 20 MG PO TABS: 20.0000 mg | ORAL_TABLET | Freq: Every day | ORAL | 0 refills | Status: DC

## 2019-11-09 MED ORDER — EZETIMIBE 10 MG PO TABS: 10.0000 mg | ORAL_TABLET | Freq: Every day | ORAL | 0 refills | Status: DC

## 2019-12-20 LAB — NMR, LIPOPROFILE
Cholesterol, Total: 127 mg/dL (ref 100–199)
HDL Particle Number: 31.9 umol/L (ref >=30.5)
HDL-C: 42 mg/dL (ref >39)
LDL Particle Number: 1064 nmol/L — ABNORMAL HIGH (ref <1000)
LDL Size: 20.1 nm — ABNORMAL LOW (ref >20.5)
LDL-C (NIH Calc): 69 mg/dL (ref 0–99)
LP-IR Score: 77 — ABNORMAL HIGH (ref <=45)
Small LDL Particle Number: 694 nmol/L — ABNORMAL HIGH (ref <=527)
Triglycerides: 80 mg/dL (ref 0–149)

## 2019-12-21 ENCOUNTER — Encounter: Payer: Self-pay | Admitting: Internal Medicine

## 2019-12-21 ENCOUNTER — Ambulatory Visit (INDEPENDENT_AMBULATORY_CARE_PROVIDER_SITE_OTHER): Payer: No Typology Code available for payment source | Admitting: Internal Medicine

## 2019-12-21 ENCOUNTER — Ambulatory Visit: Payer: No Typology Code available for payment source | Admitting: Internal Medicine

## 2019-12-21 ENCOUNTER — Other Ambulatory Visit: Payer: Self-pay

## 2019-12-21 VITALS — BP 118/62 | HR 82 | Temp 97.0°F | Ht 67.0 in | Wt 235.0 lb

## 2019-12-21 DIAGNOSIS — E7841 Elevated Lipoprotein(a): Secondary | ICD-10-CM

## 2019-12-21 DIAGNOSIS — Z8249 Family history of ischemic heart disease and other diseases of the circulatory system: Secondary | ICD-10-CM | POA: Diagnosis not present

## 2019-12-21 DIAGNOSIS — E782 Mixed hyperlipidemia: Secondary | ICD-10-CM | POA: Diagnosis not present

## 2019-12-21 NOTE — Progress Notes (Signed)
LIPID CLINIC CONSULT NOTE  Chief Complaint:  Follow-up dyslipidemia  Primary Care Physician: Dorothyann Peng, NP  HPI:  Pamela Hall is a 34 y.o. female who is being seen today for the evaluation of the meal hyperlipidemia at the request of Dorothyann Peng, NP.  Pamela Hall is a pleasant 34 year old female, who currently works as a Designer, jewellery with [employer], but previously had worked for [employer].  She was under the care of Winfred Burn, MD, who had noted on routine physical that she had markedly elevated cholesterol in 2017 during a routine physical exam.  Labs at the time showed total cholesterol of 234, triglycerides 231, HDL 34 and direct LDL 158.  Non-HDL cholesterol 200.  She had not previously had an assessment.  She was started on pravastatin 20mg  daily but developed myalgias with this.  She was then decreased to every other day dosing and still had problems with the medication.  Then it was discontinued.  She was started on ezetimibe 10 mg daily and has maintained on that.  She had a repeat lipid profile about 7 months ago which showed total cholesterol 221, triglycerides 147, HDL 38 and LDL-C of 154.  She reports a strong family history of premature coronary artery disease, including her grandmother who had diabetes and coronary artery disease starting in her 65s and her mother who had an MI when her 68s.  She also has a brother who she does not think is had cholesterol testing, and an 65-year-old son and 100-year-old daughter.  They have not had cholesterol testing.  She has no known coronary events.  Past medical history also significant for GERD and she uses a small amount of Valium for anxiety as well as Valtrex as needed.  Reports being physically active and exercises regularly without symptoms.  She reports her diet could be better, and generally consist of low saturated fats, and attempt to lower carbohydrates, but she reports being a "stress  eater" and does like sweets.  05/23/2018  Pamela Hall returns today for follow-up of her dyslipidemia.  Unfortunately there was a mixup on her ordering and her Crestor was sent to the wrong pharmacy.  We were not notified that she did not receive the medication and therefore we would not expect her numbers today to be any different than they were previously.  Overall she seems to be feeling pretty well.  She did meet on the interim with Dr.Joseph who felt that there is reasonable possibility she has an inherited dyslipidemia.  Blood work was sent to evaluate for FH.  She has continue to work on dietary changes.  I encouraged her to continue to reduce saturated fats in her diet and try to work on lower carbohydrates and weight loss.  08/21/2018  Pamela Hall is seen today in follow-up.  Overall she is doing well.  She has been taking rosuvastatin 20 mg daily in addition to ezetimibe without issue.  He did have a repeat lipoprotein NMR drawn yesterday however will take 2 to 3 days to result this.  She seems to be tolerating the medication and I am interested to see if she reaches her target LDL.  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  12/21/2019  Pamela Hall returns today for follow-up.  She continues to do well on standard therapies.  Her LDL particle  numbers trended up a little with weight gain and decreased activity.  LDL particle number is 1064, but LDL-C is 69, HDL-C 42, triglycerides 88 and total cholesterol 127.  I reviewed the numbers with her today and I feel that this could likely be improved was just more work on diet and exercise.  She is done well both on the Crestor and ezetimibe.  Testing for FH was negative which is reassuring.  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  She is well treated on current regimen.  I  will maintain her name in our database to see if she is possibly candidate for specific targeted therapy in the future.  PMHx:  Past Medical History:  Diagnosis Date  . Abnormal Pap smear 2008   COLPO;WAS NORMAL;LAST PAP 10/2011  . High cholesterol     Past Surgical History:  Procedure Laterality Date  . TUBAL LIGATION  09/30/2012   Procedure: POST PARTUM TUBAL LIGATION;  Surgeon: Michael Litter, MD;  Location: [facility];  Service: Gynecology;  Laterality: Bilateral;  . WISDOM TOOTH EXTRACTION  2003   ALL 4 EXTRACTED    FAMHx:  Family History  Problem Relation Age of Onset  . Diabetes type II Maternal Grandmother   . Hypertension Maternal Grandmother   . Heart disease Maternal Grandmother   . Anesthesia problems Maternal Grandmother        ALLERGIC TO GENERAL ANESTHESIA  . Other Maternal Grandmother   . Breast cancer Maternal Grandmother   . Schizophrenia Mother   . Bipolar disorder Mother   . Heart disease Mother   . Heart attack Mother     SOCHx:   reports that she has never smoked. She has never used smokeless tobacco. She reports that she does not drink alcohol or use drugs.  ALLERGIES:  Allergies  Allergen Reactions  . Statins     ROS: Pertinent items noted in HPI and remainder of comprehensive ROS otherwise negative.  HOME MEDS: Current Outpatient Medications on File Prior to Visit  Medication Sig Dispense Refill  . diazepam (VALIUM) 5 MG tablet Take 5 mg by mouth as needed for anxiety.    Marland Kitchen ezetimibe (ZETIA) 10 MG tablet Take 1 tablet (10 mg total) by mouth daily. 90 tablet 0  . NON FORMULARY Ashwaganda - For Stress and Anxiety    . pantoprazole (PROTONIX) 40 MG tablet Take 1 tablet (40 mg total) by mouth daily. 90 tablet 1  . rosuvastatin (CRESTOR) 20 MG tablet Take 1 tablet (20 mg total) by mouth daily. 90 tablet 0  . valACYclovir (VALTREX) 500 MG tablet Take 1 tablet by mouth once daily as needed. 90 tablet 3   No current facility-administered  medications on file prior to visit.    LABS/IMAGING: No results found for this or any previous visit (from the past 48 hour(s)). No results found.  LIPID PANEL:    Component Value Date/Time   CHOL 221 (H) 07/30/2017 0856   TRIG 147.0 07/30/2017 0856   HDL 37.80 (L) 07/30/2017 0856   CHOLHDL 6 07/30/2017 0856   VLDL 29.4 07/30/2017 0856   LDLCALC 154 (H) 07/30/2017 0856    WEIGHTS: Wt Readings from Last 3 Encounters:  12/21/19 235 lb (106.6 kg)  05/06/19 247 lb (112 kg)  11/19/18 245 lb (111.1 kg)    VITALS: BP 118/62 (BP Location: Left Arm, Patient Position: Sitting, Cuff Size: Large)   Pulse 82   Temp (!) 97 F (  36.1 C)   Ht 5\' 7"  (1.702 m)   Wt 235 lb (106.6 kg)   BMI 36.81 kg/m   EXAM: Deferred  EKG: Deferred  ASSESSMENT: 1. Possible FH - low Dutch score of 2 2. 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   Pamela Hall continues to do well on both statin and ezetimibe.  Her LDL is at target.  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  Data suggest that this is typically a little more elevated in African-Americans as well.  Ultimately she may be a candidate for direct inhibition of LP(a) however that compound has not yet available.  We will maintain her name in a database is a possible candidate for therapy.  Otherwise I think she could be well managed by her primary care provider and will ask her to follow-up with as needed.  Thanks for allowing me to participate in her care.  [place], MD, [facility], FACP  [place]  [facility]  Medical Director of the Advanced Lipid Disorders &  Cardiovascular Risk Reduction Clinic Diplomate of the American Board of Clinical Lipidology Attending Cardiologist  Direct Dial: [phone]  Fax: [phone]  Website:  [email or web address] Britni Driscoll 12/21/2019, 3:41 PM

## 2019-12-21 NOTE — Patient Instructions (Signed)
Medication Instructions:  No Changes *If you need a refill on your cardiac medications before your next appointment, please call your pharmacy*  Lab Work: None  Testing/Procedures: None  Follow-Up: At [facility], you and your health needs are our priority.  As part of our continuing mission to provide you with exceptional heart care, we have created designated Provider Care Teams.  These Care Teams include your primary Cardiologist (physician) and Advanced Practice Providers (APPs -  Physician Assistants and Nurse Practitioners) who all work together to provide you with the care you need, when you need it.  We recommend signing up for the patient portal called "MyChart".  Sign up information is provided on this After Visit Summary.  MyChart is used to connect with patients for Virtual Visits (Telemedicine).  Patients are able to view lab/test results, encounter notes, upcoming appointments, etc.  Non-urgent messages can be sent to your provider as well.   To learn more about what you can do with MyChart, go to [email or web address].    Other Instructions Follow up as needed

## 2020-02-24 ENCOUNTER — Other Ambulatory Visit: Payer: Self-pay | Admitting: Internal Medicine

## 2020-02-24 DIAGNOSIS — E7801 Familial hypercholesterolemia: Secondary | ICD-10-CM

## 2020-03-02 ENCOUNTER — Other Ambulatory Visit: Payer: Self-pay

## 2020-03-02 NOTE — Progress Notes (Signed)
Subjective:    Patient ID: Pamela Hall' Raliegh Ip, female    DOB: 1986/02/03, 34 y.o.   MRN: [id number]  HPI Patient presents for yearly preventative medicine examination. She is a pleasant 34 year old female who  has a past medical history of Abnormal Pap smear (2008) and High cholesterol.  Possible FH/Elevated LPA -is managed by the lipid clinic.  She is doing well on both Crestor and Zetia.  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  Cardiology thought that she could be managed by her primary care provider and she can follow-up as needed.  GERD - Takes Protonix 40 mg PRN  Anxiety -she has been prescribed Valium to take as needed but finds that this is too sedating for her.  She does feel as though her anxiety has increased during the pandemic and friends have also mentioned this to her.  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  She is interested in starting Lexapro and would like a prescription for Ativan that she can take while flying.  Right Knee Pain -intermittent for greater than 6 months.  Feels as though the pain is coming from the inside of her knee and lasts for a couple of days and then resolves for a few weeks or more.  She denies clicking, grinding, or locking sensation.  She denies trauma or aggravating injury  TB testing -she needs QuantiFERON gold testing for school  All immunizations and health maintenance protocols were reviewed with the patient and needed orders were placed. She is due for tdap  Appropriate screening laboratory values were ordered for the patient including screening of hyperlipidemia, renal function and hepatic function.   Medication reconciliation,  past medical history, social history, problem list and allergies were reviewed in detail with the patient  Goals were established with regard to weight loss, exercise, and   diet in compliance with medications  Wt Readings from Last 3 Encounters:  03/03/20 240 lb (108.9 kg)  12/21/19 235 lb (106.6 kg)  05/06/19 247 lb (112 kg)    Review of Systems  Constitutional: Negative.   HENT: Negative.   Eyes: Negative.   Respiratory: Negative.   Cardiovascular: Negative.   Gastrointestinal: Negative.   Endocrine: Negative.   Genitourinary: Negative.   Musculoskeletal: Positive for arthralgias.  Skin: Negative.   Allergic/Immunologic: Negative.   Neurological: Negative.   Hematological: Negative.   Psychiatric/Behavioral: The patient is nervous/anxious.    Past Medical History:  Diagnosis Date  . Abnormal Pap smear 2008   COLPO;WAS NORMAL;LAST PAP 10/2011  . High cholesterol     Social History   Socioeconomic History  . Marital status: Married    Spouse name: Lucia Gaskins  . Number of children: 1  . Years of education: 60  . Highest education level: Not on file  Occupational History  . Occupation: NP     Employer: [employer]  Tobacco Use  . Smoking status: Never Smoker  . Smokeless tobacco: Never Used  Substance and Sexual Activity  . Alcohol use: No    Alcohol/week: 1.0 - 2.0 standard drinks    Types: 1 - 2 Glasses of wine per week    Comment: SOCIAL DRINKER  . Drug use: No  . Sexual activity: Not Currently    Partners: Male    Birth control/protection: Surgical  Other Topics Concern  .  Not on file  Social History Narrative  . Not on file   Social Determinants of Health   Financial Resource Strain:   . Difficulty of Paying Living Expenses:   Food Insecurity:   . Worried About Programme researcher, broadcasting/film/video in the Last Year:   . Barista in the Last Year:   Transportation Needs:   . Freight forwarder (Medical):   Marland Kitchen Lack of Transportation (Non-Medical):   Physical Activity:   . Days of Exercise per Week:   . Minutes of Exercise per Session:   Stress:   . Feeling of Stress :   Social Connections:   . Frequency of  Communication with Friends and Family:   . Frequency of Social Gatherings with Friends and Family:   . Attends Religious Services:   . Active Member of Clubs or Organizations:   . Attends Banker Meetings:   Marland Kitchen Marital Status:   Intimate Partner Violence:   . Fear of Current or Ex-Partner:   . Emotionally Abused:   Marland Kitchen Physically Abused:   . Sexually Abused:     Past Surgical History:  Procedure Laterality Date  . TUBAL LIGATION  09/30/2012   Procedure: POST PARTUM TUBAL LIGATION;  Surgeon: Michael Litter, MD;  Location: [facility];  Service: Gynecology;  Laterality: Bilateral;  . WISDOM TOOTH EXTRACTION  2003   ALL 4 EXTRACTED    Family History  Problem Relation Age of Onset  . Diabetes type II Maternal Grandmother   . Hypertension Maternal Grandmother   . Heart disease Maternal Grandmother   . Anesthesia problems Maternal Grandmother        ALLERGIC TO GENERAL ANESTHESIA  . Other Maternal Grandmother   . Breast cancer Maternal Grandmother   . Schizophrenia Mother   . Bipolar disorder Mother   . Heart disease Mother   . Heart attack Mother     Allergies  Allergen Reactions  . Statins     Current Outpatient Medications on File Prior to Visit  Medication Sig Dispense Refill  . NON FORMULARY Ashwaganda - For Stress and Anxiety    . pantoprazole (PROTONIX) 40 MG tablet Take 1 tablet (40 mg total) by mouth daily. 90 tablet 1  . rosuvastatin (CRESTOR) 20 MG tablet Take 1 tablet (20 mg total) by mouth daily. 90 tablet 2  . valACYclovir (VALTREX) 500 MG tablet Take 1 tablet by mouth once daily as needed. 90 tablet 3   No current facility-administered medications on file prior to visit.    BP 104/70   Temp 97.7 F (36.5 C)   Ht 5\' 7"  (1.702 m)   Wt 240 lb (108.9 kg)   BMI 37.59 kg/m       Objective:   Physical Exam Vitals and nursing note reviewed.  Constitutional:      General: She is not in acute distress.    Appearance: Normal appearance. She is  well-developed. She is not ill-appearing.  HENT:     Head: Normocephalic and atraumatic.     Right Ear: Tympanic membrane, ear canal and external ear normal. There is no impacted cerumen.     Left Ear: Tympanic membrane, ear canal and external ear normal. There is no impacted cerumen.     Nose: Nose normal. No congestion or rhinorrhea.     Mouth/Throat:     Mouth: Mucous membranes are moist.     Pharynx: Oropharynx is clear. No oropharyngeal exudate or posterior oropharyngeal erythema.  Eyes:  General:        Right eye: No discharge.        Left eye: No discharge.     Extraocular Movements: Extraocular movements intact.     Conjunctiva/sclera: Conjunctivae normal.     Pupils: Pupils are equal, round, and reactive to light.  Neck:     Thyroid: No thyromegaly.     Vascular: No carotid bruit.     Trachea: No tracheal deviation.  Cardiovascular:     Rate and Rhythm: Normal rate and regular rhythm.     Pulses: Normal pulses.     Heart sounds: Normal heart sounds. No murmur. No friction rub. No gallop.   Pulmonary:     Effort: Pulmonary effort is normal. No respiratory distress.     Breath sounds: Normal breath sounds. No stridor. No wheezing, rhonchi or rales.  Chest:     Chest wall: No tenderness.  Abdominal:     General: Abdomen is flat. Bowel sounds are normal. There is no distension.     Palpations: Abdomen is soft. There is no mass.     Tenderness: There is no abdominal tenderness. There is no right CVA tenderness, left CVA tenderness, guarding or rebound.     Hernia: No hernia is present.  Musculoskeletal:        General: No swelling, tenderness, deformity or signs of injury. Normal range of motion.     Cervical back: Normal range of motion and neck supple.     Right lower leg: Normal. No tenderness or bony tenderness. No edema.     Left lower leg: No edema.  Lymphadenopathy:     Cervical: No cervical adenopathy.  Skin:    General: Skin is warm and dry.     Coloration:  Skin is not jaundiced or pale.     Findings: No bruising, erythema, lesion or rash.  Neurological:     General: No focal deficit present.     Mental Status: She is alert and oriented to person, place, and time.     Cranial Nerves: No cranial nerve deficit.     Sensory: No sensory deficit.     Motor: No weakness.     Coordination: Coordination normal.     Gait: Gait normal.     Deep Tendon Reflexes: Reflexes normal.  Psychiatric:        Mood and Affect: Mood normal.        Behavior: Behavior normal.        Thought Content: Thought content normal.        Judgment: Judgment normal.       Assessment & Plan:  1. Routine general medical examination at a health care facility - Continue to stay active and eat healthy  - Follow up in one year or sooner if needed - CBC with Differential/Platelet - Comprehensive metabolic panel - TSH - QuantiFERON-TB Gold Plus  2. Familial hypercholesterolemia - will take over medication management from lipid clinic  - CBC with Differential/Platelet - Comprehensive metabolic panel - TSH - ezetimibe (ZETIA) 10 MG tablet; Take 1 tablet (10 mg total) by mouth daily.  Dispense: 90 tablet; Refill: 3  3. Anxiety -We will start her on Lexapro 10 mg daily.  She was advised to follow-up in 1 month if symptoms are not improved on that dose - escitalopram (LEXAPRO) 10 MG tablet; Take 1 tablet (10 mg total) by mouth daily.  Dispense: 90 tablet; Refill: 0 - LORazepam (ATIVAN) 1 MG tablet; Take 1 tablet (1 mg total)  by mouth as needed for anxiety (for flying).  Dispense: 30 tablet; Refill: 1  4. Chronic pain of right knee - Likely arthritis  - DG Knee 3 Views Right; Future - Consider steroid injection   Shirline Frees, NP

## 2020-03-03 ENCOUNTER — Ambulatory Visit (INDEPENDENT_AMBULATORY_CARE_PROVIDER_SITE_OTHER): Payer: No Typology Code available for payment source | Admitting: Adult Health

## 2020-03-03 ENCOUNTER — Encounter: Payer: Self-pay | Admitting: Adult Health

## 2020-03-03 VITALS — BP 104/70 | Temp 97.7°F | Ht 67.0 in | Wt 240.0 lb

## 2020-03-03 DIAGNOSIS — Z23 Encounter for immunization: Secondary | ICD-10-CM | POA: Diagnosis not present

## 2020-03-03 DIAGNOSIS — E7801 Familial hypercholesterolemia: Secondary | ICD-10-CM

## 2020-03-03 DIAGNOSIS — F419 Anxiety disorder, unspecified: Secondary | ICD-10-CM | POA: Diagnosis not present

## 2020-03-03 DIAGNOSIS — M25561 Pain in right knee: Secondary | ICD-10-CM

## 2020-03-03 DIAGNOSIS — G8929 Other chronic pain: Secondary | ICD-10-CM

## 2020-03-03 DIAGNOSIS — Z0000 Encounter for general adult medical examination without abnormal findings: Secondary | ICD-10-CM | POA: Diagnosis not present

## 2020-03-03 LAB — CBC WITH DIFFERENTIAL/PLATELET
Basophils Absolute: 0 10*3/uL (ref 0.0–0.1)
Basophils Relative: 0.6 % (ref 0.0–3.0)
Eosinophils Absolute: 0.1 10*3/uL (ref 0.0–0.7)
Eosinophils Relative: 1.3 % (ref 0.0–5.0)
HCT: 41.5 % (ref 36.0–46.0)
Hemoglobin: 14 g/dL (ref 12.0–15.0)
Lymphocytes Relative: 34.1 % (ref 12.0–46.0)
Lymphs Abs: 2 10*3/uL (ref 0.7–4.0)
MCHC: 33.7 g/dL (ref 30.0–36.0)
MCV: 82.2 fl (ref 78.0–100.0)
Monocytes Absolute: 0.6 10*3/uL (ref 0.1–1.0)
Monocytes Relative: 9.8 % (ref 3.0–12.0)
Neutro Abs: 3.2 10*3/uL (ref 1.4–7.7)
Neutrophils Relative %: 54.2 % (ref 43.0–77.0)
Platelets: 209 10*3/uL (ref 150.0–400.0)
RBC: 5.05 Mil/uL (ref 3.87–5.11)
RDW: 13.5 % (ref 11.5–15.5)
WBC: 6 10*3/uL (ref 4.0–10.5)

## 2020-03-03 LAB — TSH: TSH: 0.84 u[IU]/mL (ref 0.35–4.50)

## 2020-03-03 LAB — COMPREHENSIVE METABOLIC PANEL
ALT: 25 U/L (ref 0–35)
AST: 17 U/L (ref 0–37)
Albumin: 4.1 g/dL (ref 3.5–5.2)
Alkaline Phosphatase: 72 U/L (ref 39–117)
BUN: 9 mg/dL (ref 6–23)
CO2: 28 mEq/L (ref 19–32)
Calcium: 9 mg/dL (ref 8.4–10.5)
Chloride: 104 mEq/L (ref 96–112)
Creatinine, Ser: 0.74 mg/dL (ref 0.40–1.20)
GFR: 107.78 mL/min (ref >60.00)
Glucose, Bld: 102 mg/dL — ABNORMAL HIGH (ref 70–99)
Potassium: 4.1 mEq/L (ref 3.5–5.1)
Sodium: 135 mEq/L (ref 135–145)
Total Bilirubin: 0.3 mg/dL (ref 0.2–1.2)
Total Protein: 7.2 g/dL (ref 6.0–8.3)

## 2020-03-03 MED ORDER — ESCITALOPRAM OXALATE 10 MG PO TABS: 10.0000 mg | ORAL_TABLET | Freq: Every day | ORAL | 0 refills | Status: DC

## 2020-03-03 MED ORDER — LORAZEPAM 1 MG PO TABS: 1.0000 mg | ORAL_TABLET | ORAL | 1 refills | Status: DC | PRN

## 2020-03-03 MED ORDER — EZETIMIBE 10 MG PO TABS: 10.0000 mg | ORAL_TABLET | Freq: Every day | ORAL | 3 refills | Status: DC

## 2020-03-03 NOTE — Addendum Note (Signed)
Addended by: Raj Janus T on: 03/03/2020 08:08 AM   Modules accepted: Orders

## 2020-03-07 LAB — QUANTIFERON-TB GOLD PLUS
Mitogen-NIL: 8.46 IU/mL
NIL: 0.02 IU/mL
QuantiFERON-TB Gold Plus: NEGATIVE
TB1-NIL: 0 IU/mL
TB2-NIL: 0 IU/mL

## 2020-03-31 ENCOUNTER — Other Ambulatory Visit: Payer: Self-pay | Admitting: Adult Health

## 2020-03-31 DIAGNOSIS — F419 Anxiety disorder, unspecified: Secondary | ICD-10-CM

## 2020-04-01 NOTE — Telephone Encounter (Signed)
THIS WAS SENT TO THE PHARMACY ON 03/03/2020 FOR 90 DAYS.  REQUEST IS TOO EARLY. 

## 2020-04-01 NOTE — Telephone Encounter (Signed)
Message routed to PCP CMA  

## 2020-04-07 ENCOUNTER — Other Ambulatory Visit: Payer: Self-pay

## 2020-04-29 ENCOUNTER — Other Ambulatory Visit: Payer: Self-pay | Admitting: Adult Health

## 2020-04-29 DIAGNOSIS — F419 Anxiety disorder, unspecified: Secondary | ICD-10-CM

## 2020-05-03 ENCOUNTER — Encounter: Payer: Self-pay | Admitting: Family Medicine

## 2020-05-03 NOTE — Telephone Encounter (Signed)
Sent to the pharmacy by e-scribe. 

## 2020-07-14 ENCOUNTER — Other Ambulatory Visit: Payer: Self-pay

## 2020-07-15 ENCOUNTER — Encounter: Payer: Self-pay | Admitting: Adult Health

## 2020-07-15 ENCOUNTER — Ambulatory Visit (INDEPENDENT_AMBULATORY_CARE_PROVIDER_SITE_OTHER): Payer: No Typology Code available for payment source | Admitting: Adult Health

## 2020-07-15 VITALS — BP 108/70 | HR 67 | Temp 98.8°F | Ht 67.0 in | Wt 245.0 lb

## 2020-07-15 DIAGNOSIS — N644 Mastodynia: Secondary | ICD-10-CM | POA: Diagnosis not present

## 2020-07-15 DIAGNOSIS — Z23 Encounter for immunization: Secondary | ICD-10-CM

## 2020-07-15 NOTE — Patient Instructions (Addendum)
It was great seeing you today   I have ordered a screening mammogram to make sure nothing is going on. I believe it is more related to fibrous breast tissue.

## 2020-07-15 NOTE — Progress Notes (Signed)
Subjective:    Patient ID: Pamela Hall, female    DOB: 05/16/1986, 34 y.o.   MRN: [id number]  HPI 34 year old female who is being evaluated today for an acute issue of bilateral breast pain.  She reports that she has had intermittent breast pain for the last 2 weeks.  Pain seems to be worse L>R.  Pain is felt as a rapid sharp shooting pain that only last a few seconds.  She does not develop this pain every day.  Pain is rated at a 6-7 out of 10.  Is never experienced this pain before.  Her last menstrual period started 2 days ago.  Denies aggravating factors when she does have this discomfort.  History of  maternal grandmother developing breast cancer in her 64s  Review of Systems  See HPI   Past Medical History:  Diagnosis Date  . Abnormal Pap smear 2008   COLPO;WAS NORMAL;LAST PAP 10/2011  . High cholesterol     Social History   Socioeconomic History  . Marital status: Married    Spouse name: Leeanne Rio  . Number of children: 1  . Years of education: 40  . Highest education level: Not on file  Occupational History  . Occupation: NP     Employer: [employer]  Tobacco Use  . Smoking status: Never Smoker  . Smokeless tobacco: Never Used  Vaping Use  . Vaping Use: Never used  Substance and Sexual Activity  . Alcohol use: No    Alcohol/week: 1.0 - 2.0 standard drink    Types: 1 - 2 Glasses of wine per week    Comment: SOCIAL DRINKER  . Drug use: No  . Sexual activity: Not Currently    Partners: Male    Birth control/protection: Surgical  Other Topics Concern  . Not on file  Social History Narrative  . Not on file   Social Determinants of Health   Financial Resource Strain:   . Difficulty of Paying Living Expenses: Not on file  Food Insecurity:   . Worried About Programme researcher, broadcasting/film/video in the Last Year: Not on file  . Ran Out of Food in the Last Year: Not on file  Transportation Needs:   . Lack of Transportation (Medical): Not on file  .  Lack of Transportation (Non-Medical): Not on file  Physical Activity:   . Days of Exercise per Week: Not on file  . Minutes of Exercise per Session: Not on file  Stress:   . Feeling of Stress : Not on file  Social Connections:   . Frequency of Communication with Friends and Family: Not on file  . Frequency of Social Gatherings with Friends and Family: Not on file  . Attends Religious Services: Not on file  . Active Member of Clubs or Organizations: Not on file  . Attends Banker Meetings: Not on file  . Marital Status: Not on file  Intimate Partner Violence:   . Fear of Current or Ex-Partner: Not on file  . Emotionally Abused: Not on file  . Physically Abused: Not on file  . Sexually Abused: Not on file    Past Surgical History:  Procedure Laterality Date  . TUBAL LIGATION  09/30/2012   Procedure: POST PARTUM TUBAL LIGATION;  Surgeon: Michael Litter, MD;  Location: [facility];  Service: Gynecology;  Laterality: Bilateral;  . WISDOM TOOTH EXTRACTION  2003   ALL 4 EXTRACTED    Family History  Problem Relation Age of Onset  .  Diabetes type II Maternal Grandmother   . Hypertension Maternal Grandmother   . Heart disease Maternal Grandmother   . Anesthesia problems Maternal Grandmother        ALLERGIC TO GENERAL ANESTHESIA  . Other Maternal Grandmother   . Breast cancer Maternal Grandmother   . Schizophrenia Mother   . Bipolar disorder Mother   . Heart disease Mother   . Heart attack Mother     Allergies  Allergen Reactions  . Statins     Current Outpatient Medications on File Prior to Visit  Medication Sig Dispense Refill  . escitalopram (LEXAPRO) 10 MG tablet TAKE 1 TABLET(10 MG) BY MOUTH DAILY 90 tablet 1  . ezetimibe (ZETIA) 10 MG tablet Take 1 tablet (10 mg total) by mouth daily. 90 tablet 3  . LORazepam (ATIVAN) 1 MG tablet Take 1 tablet (1 mg total) by mouth as needed for anxiety (for flying). 30 tablet 1  . NON FORMULARY Ashwaganda - For Stress and  Anxiety    . pantoprazole (PROTONIX) 40 MG tablet Take 1 tablet (40 mg total) by mouth daily. 90 tablet 1  . rosuvastatin (CRESTOR) 20 MG tablet Take 1 tablet (20 mg total) by mouth daily. 90 tablet 2  . valACYclovir (VALTREX) 500 MG tablet Take 1 tablet by mouth once daily as needed. 90 tablet 3   No current facility-administered medications on file prior to visit.    BP 108/70 (BP Location: Left Arm, Patient Position: Sitting, Cuff Size: Large)   Pulse 67   Temp 98.8 F (37.1 C) (Oral)   Ht 5\' 7"  (1.702 m)   Wt 245 lb (111.1 kg)   LMP 07/13/2020   SpO2 97%   BMI 38.37 kg/m       Objective:   Physical Exam Vitals and nursing note reviewed. Exam conducted with a chaperone present.  Constitutional:      Appearance: Normal appearance.  Cardiovascular:     Rate and Rhythm: Normal rate and regular rhythm.     Pulses: Normal pulses.     Heart sounds: Normal heart sounds.  Pulmonary:     Effort: Pulmonary effort is normal.     Breath sounds: Normal breath sounds.  Chest:     Breasts: Breasts are symmetrical.        Right: Tenderness present. No swelling, bleeding, inverted nipple, mass, nipple discharge or skin change.        Left: Normal. No swelling, bleeding, inverted nipple, mass, nipple discharge, skin change or tenderness.     Comments: Mild tenderness with palpation under right breast.  Fibrotic breast tissue noted bilaterally.  No lumps or masses Lymphadenopathy:     Upper Body:     Right upper body: No supraclavicular, axillary or pectoral adenopathy.     Left upper body: No supraclavicular, axillary or pectoral adenopathy.  Neurological:     Mental Status: She is alert.  Psychiatric:        Mood and Affect: Mood normal.        Behavior: Behavior normal.        Thought Content: Thought content normal.        Judgment: Judgment normal.       Assessment & Plan:  1. Acute breast pain -Not overtly concerned for malignancy.  Discomfort likely from fibrotic breast  tissue.  Advised to wear sports bras for the next few weeks and can also take anti-inflammatory medication. Will  order screening mammogram  - MM DIGITAL SCREENING BILATERAL; Future  2. Need  for influenza vaccination  - Flu Vaccine QUAD 36+ mos IM

## 2020-07-18 ENCOUNTER — Other Ambulatory Visit: Payer: Self-pay | Admitting: Adult Health

## 2020-07-18 DIAGNOSIS — Z1231 Encounter for screening mammogram for malignant neoplasm of breast: Secondary | ICD-10-CM

## 2020-08-02 ENCOUNTER — Other Ambulatory Visit: Payer: Self-pay

## 2020-08-02 ENCOUNTER — Ambulatory Visit
Admission: RE | Admit: 2020-08-02 | Discharge: 2020-08-02 | Disposition: A | Discharge status: Home / Self Care | Payer: No Typology Code available for payment source | Source: Ambulatory Visit

## 2020-08-02 DIAGNOSIS — Z1231 Encounter for screening mammogram for malignant neoplasm of breast: Secondary | ICD-10-CM

## 2020-08-05 ENCOUNTER — Other Ambulatory Visit: Payer: Self-pay | Admitting: Adult Health

## 2020-08-05 DIAGNOSIS — R928 Other abnormal and inconclusive findings on diagnostic imaging of breast: Secondary | ICD-10-CM

## 2020-08-23 ENCOUNTER — Ambulatory Visit
Admission: RE | Admit: 2020-08-23 | Discharge: 2020-08-23 | Disposition: A | Discharge status: Home / Self Care | Payer: No Typology Code available for payment source | Source: Ambulatory Visit | Attending: Adult Health | Admitting: Adult Health

## 2020-08-23 ENCOUNTER — Other Ambulatory Visit: Payer: Self-pay

## 2020-08-23 DIAGNOSIS — R928 Other abnormal and inconclusive findings on diagnostic imaging of breast: Secondary | ICD-10-CM

## 2020-12-16 ENCOUNTER — Other Ambulatory Visit: Payer: Self-pay | Admitting: Adult Health

## 2020-12-16 DIAGNOSIS — F419 Anxiety disorder, unspecified: Secondary | ICD-10-CM

## 2020-12-27 ENCOUNTER — Other Ambulatory Visit: Payer: Self-pay

## 2020-12-28 ENCOUNTER — Ambulatory Visit (INDEPENDENT_AMBULATORY_CARE_PROVIDER_SITE_OTHER): Payer: No Typology Code available for payment source

## 2020-12-28 ENCOUNTER — Ambulatory Visit (INDEPENDENT_AMBULATORY_CARE_PROVIDER_SITE_OTHER): Payer: No Typology Code available for payment source | Admitting: Adult Health

## 2020-12-28 ENCOUNTER — Encounter: Payer: Self-pay | Admitting: Adult Health

## 2020-12-28 VITALS — BP 119/79 | HR 64 | Temp 98.2°F | Wt 261.4 lb

## 2020-12-28 DIAGNOSIS — M25561 Pain in right knee: Secondary | ICD-10-CM | POA: Diagnosis not present

## 2020-12-28 DIAGNOSIS — G8929 Other chronic pain: Secondary | ICD-10-CM

## 2020-12-28 NOTE — Progress Notes (Signed)
Subjective:    Patient ID: Pamela Hall, female    DOB: Aug 01, 1986, 35 y.o.   MRN: [id number]  HPI  35 year old female who  has a past medical history of Abnormal Pap smear (2008) and High cholesterol.  She presents to the office today for chronic right knee pain.  She was first evaluated for this approximately 10 months ago and the pain had been present for approximately 6 months prior.  Pain is worse with ambulation and walking long distances.  If she walks too much during the day she will have some pain when laying down at night.  Has not noticed any clicking, grinding, or locking sensation.  Denies redness, warmth, or swelling.  Does not feel as though her knee is going to give out.  She never had her x-ray done last year.  Review of Systems See HPI   Past Medical History:  Diagnosis Date  . Abnormal Pap smear 2008   COLPO;WAS NORMAL;LAST PAP 10/2011  . High cholesterol     Social History   Socioeconomic History  . Marital status: Married    Spouse name: Leeanne Rio  . Number of children: 1  . Years of education: 22  . Highest education level: Not on file  Occupational History  . Occupation: NP     Employer: [employer]  Tobacco Use  . Smoking status: Never Smoker  . Smokeless tobacco: Never Used  Vaping Use  . Vaping Use: Never used  Substance and Sexual Activity  . Alcohol use: No    Alcohol/week: 1.0 - 2.0 standard drink    Types: 1 - 2 Glasses of wine per week    Comment: SOCIAL DRINKER  . Drug use: No  . Sexual activity: Not Currently    Partners: Male    Birth control/protection: Surgical  Other Topics Concern  . Not on file  Social History Narrative  . Not on file   Social Determinants of Health   Financial Resource Strain: Not on file  Food Insecurity: Not on file  Transportation Needs: Not on file  Physical Activity: Not on file  Stress: Not on file  Social Connections: Not on file  Intimate Partner Violence: Not on  file    Past Surgical History:  Procedure Laterality Date  . TUBAL LIGATION  09/30/2012   Procedure: POST PARTUM TUBAL LIGATION;  Surgeon: Michael Litter, MD;  Location: [facility];  Service: Gynecology;  Laterality: Bilateral;  . WISDOM TOOTH EXTRACTION  2003   ALL 4 EXTRACTED    Family History  Problem Relation Age of Onset  . Diabetes type II Maternal Grandmother   . Hypertension Maternal Grandmother   . Heart disease Maternal Grandmother   . Anesthesia problems Maternal Grandmother        ALLERGIC TO GENERAL ANESTHESIA  . Other Maternal Grandmother   . Breast cancer Maternal Grandmother   . Schizophrenia Mother   . Bipolar disorder Mother   . Heart disease Mother   . Heart attack Mother     Allergies  Allergen Reactions  . Statins     Current Outpatient Medications on File Prior to Visit  Medication Sig Dispense Refill  . escitalopram (LEXAPRO) 10 MG tablet TAKE 1 TABLET(10 MG) BY MOUTH DAILY 90 tablet 1  . LORazepam (ATIVAN) 1 MG tablet Take 1 tablet (1 mg total) by mouth as needed for anxiety (for flying). 30 tablet 1  . NON FORMULARY Ashwaganda - For Stress and Anxiety    .  pantoprazole (PROTONIX) 40 MG tablet Take 1 tablet (40 mg total) by mouth daily. 90 tablet 1  . rosuvastatin (CRESTOR) 20 MG tablet Take 1 tablet (20 mg total) by mouth daily. 90 tablet 2  . valACYclovir (VALTREX) 500 MG tablet Take 1 tablet by mouth once daily as needed. 90 tablet 3  . ezetimibe (ZETIA) 10 MG tablet Take 1 tablet (10 mg total) by mouth daily. (Patient not taking: Reported on 12/28/2020) 90 tablet 3   No current facility-administered medications on file prior to visit.    BP 119/79 (BP Location: Right Arm, Patient Position: Sitting, Cuff Size: Large)   Pulse 64   Temp 98.2 F (36.8 C) (Oral)   Wt 261 lb 6.4 oz (118.6 kg)   LMP 12/18/2020   SpO2 97%   BMI 40.94 kg/m       Objective:   Physical Exam Vitals and nursing note reviewed.  Constitutional:      Appearance:  Normal appearance.  Musculoskeletal:        General: No swelling, tenderness, deformity or signs of injury. Normal range of motion.     Right knee: No bony tenderness or crepitus. Normal range of motion. No tenderness. No LCL laxity, MCL laxity, ACL laxity or PCL laxity. Normal alignment, normal meniscus and normal patellar mobility. Normal pulse.     Instability Tests: Anterior drawer test negative. Posterior drawer test negative.     Left knee: Normal.  Skin:    General: Skin is warm and dry.  Neurological:     General: No focal deficit present.     Mental Status: She is oriented to person, place, and time.     Motor: No weakness.  Psychiatric:        Mood and Affect: Mood normal.        Behavior: Behavior normal.        Thought Content: Thought content normal.        Judgment: Judgment normal.       Assessment & Plan:  1. Chronic pain of right knee -No concern for soft tissue injury.  We will get x-ray of right knee today.  Likely osteoarthritis.  Can consider further imaging if needed such as an MRI.  Also consider steroid injection for symptom relief. - DG Knee 3 Views Right; Future   Shirline Frees, NP

## 2021-01-03 ENCOUNTER — Other Ambulatory Visit: Payer: Self-pay

## 2021-01-04 ENCOUNTER — Encounter: Payer: Self-pay | Admitting: Adult Health

## 2021-01-04 ENCOUNTER — Ambulatory Visit (INDEPENDENT_AMBULATORY_CARE_PROVIDER_SITE_OTHER): Payer: No Typology Code available for payment source | Admitting: Adult Health

## 2021-01-04 VITALS — BP 124/78 | HR 90 | Temp 98.0°F | Wt 262.2 lb

## 2021-01-04 DIAGNOSIS — M25561 Pain in right knee: Secondary | ICD-10-CM | POA: Diagnosis not present

## 2021-01-04 DIAGNOSIS — G8929 Other chronic pain: Secondary | ICD-10-CM | POA: Diagnosis not present

## 2021-01-04 MED ORDER — METHYLPREDNISOLONE ACETATE 40 MG/ML IJ SUSP
40.0000 mg | Freq: Once | INTRAMUSCULAR | Status: AC
  Administered 2021-01-04: 40 mg via INTRA_ARTICULAR

## 2021-01-04 MED ORDER — METHYLPREDNISOLONE ACETATE 80 MG/ML IJ SUSP: 80.0000 mg | Freq: Once | INTRAMUSCULAR | Status: DC

## 2021-01-04 NOTE — Progress Notes (Addendum)
Subjective:    Patient ID: Pamela Hall, female    DOB: June 28, 1986, 35 y.o.   MRN: [id number]  HPI 35 year old female who  has a past medical history of Abnormal Pap smear (2008) and High cholesterol.  Presents to the office today for treatment due to chronic right knee pain.  Was first evaluated roughly 10 months ago and at that time the pain had been present for 6 months prior.  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  We discussed options for treatment at this time and she opted for steroid injection.    Review of Systems See HPI   Past Medical History:  Diagnosis Date  . Abnormal Pap smear 2008   COLPO;WAS NORMAL;LAST PAP 10/2011  . High cholesterol     Social History   Socioeconomic History  . Marital status: Married    Spouse name: Leeanne Rio  . Number of children: 1  . Years of education: 56  . Highest education level: Not on file  Occupational History  . Occupation: NP     Employer: [employer]  Tobacco Use  . Smoking status: Never Smoker  . Smokeless tobacco: Never Used  Vaping Use  . Vaping Use: Never used  Substance and Sexual Activity  . Alcohol use: No    Alcohol/week: 1.0 - 2.0 standard drink    Types: 1 - 2 Glasses of wine per week    Comment: SOCIAL DRINKER  . Drug use: No  . Sexual activity: Not Currently    Partners: Male    Birth control/protection: Surgical  Other Topics Concern  . Not on file  Social History Narrative  . Not on file   Social Determinants of Health   Financial Resource Strain: Not on file  Food Insecurity: Not on file  Transportation Needs: Not on file  Physical Activity: Not on file  Stress: Not on file  Social Connections: Not on file  Intimate Partner Violence: Not on file    Past Surgical History:  Procedure Laterality Date  . TUBAL LIGATION  09/30/2012   Procedure: POST  PARTUM TUBAL LIGATION;  Surgeon: Michael Litter, MD;  Location: [facility];  Service: Gynecology;  Laterality: Bilateral;  . WISDOM TOOTH EXTRACTION  2003   ALL 4 EXTRACTED    Family History  Problem Relation Age of Onset  . Diabetes type II Maternal Grandmother   . Hypertension Maternal Grandmother   . Heart disease Maternal Grandmother   . Anesthesia problems Maternal Grandmother        ALLERGIC TO GENERAL ANESTHESIA  . Other Maternal Grandmother   . Breast cancer Maternal Grandmother   . Schizophrenia Mother   . Bipolar disorder Mother   . Heart disease Mother   . Heart attack Mother     Allergies  Allergen Reactions  . Statins     Current Outpatient Medications on File Prior to Visit  Medication Sig Dispense Refill  . escitalopram (LEXAPRO) 10 MG tablet TAKE 1 TABLET(10 MG) BY MOUTH DAILY 90 tablet 1  . ezetimibe (ZETIA) 10 MG tablet Take 1 tablet (10 mg total) by mouth daily. (Patient not taking: Reported on 12/28/2020) 90 tablet 3  . LORazepam (ATIVAN) 1 MG tablet Take 1 tablet (1 mg total) by mouth as needed for anxiety (for flying). 30 tablet 1  . NON FORMULARY Ashwaganda - For Stress and Anxiety    .  pantoprazole (PROTONIX) 40 MG tablet Take 1 tablet (40 mg total) by mouth daily. 90 tablet 1  . rosuvastatin (CRESTOR) 20 MG tablet Take 1 tablet (20 mg total) by mouth daily. 90 tablet 2  . valACYclovir (VALTREX) 500 MG tablet Take 1 tablet by mouth once daily as needed. 90 tablet 3   No current facility-administered medications on file prior to visit.    LMP 12/18/2020       Objective:   Physical Exam Vitals and nursing note reviewed.  Constitutional:      Appearance: Normal appearance.  Musculoskeletal:     Right knee: Bony tenderness present. Decreased range of motion. Tenderness present.  Skin:    General: Skin is warm and dry.  Neurological:     General: No focal deficit present.     Mental Status: She is alert and oriented to person, place, and time.   Psychiatric:        Mood and Affect: Mood normal.        Behavior: Behavior normal.        Thought Content: Thought content normal.        Judgment: Judgment normal.       Assessment & Plan:  1. Chronic pain of right knee Discussed risks and benefits of corticosteroid injection and patient consented.  After prepping skin with betadine, injected 40 mg depomedrol and 2 cc of plain xylocaine with 22 gauge one and one half inch needle using anterolateral approach and pt tolerated well.     Shirline Frees, NP

## 2021-01-04 NOTE — Addendum Note (Signed)
Addended by: Nancy Fetter on: 01/04/2021 01:40 PM   Modules accepted: Orders

## 2021-01-20 ENCOUNTER — Other Ambulatory Visit: Payer: Self-pay | Admitting: Internal Medicine

## 2021-01-20 DIAGNOSIS — E7801 Familial hypercholesterolemia: Secondary | ICD-10-CM

## 2021-03-17 ENCOUNTER — Other Ambulatory Visit: Payer: Self-pay

## 2021-03-17 ENCOUNTER — Ambulatory Visit (INDEPENDENT_AMBULATORY_CARE_PROVIDER_SITE_OTHER): Payer: No Typology Code available for payment source | Admitting: Adult Health

## 2021-03-17 ENCOUNTER — Encounter: Payer: Self-pay | Admitting: Adult Health

## 2021-03-17 VITALS — BP 108/80 | HR 82 | Temp 98.5°F | Ht 66.75 in | Wt 264.0 lb

## 2021-03-17 DIAGNOSIS — Z0000 Encounter for general adult medical examination without abnormal findings: Secondary | ICD-10-CM | POA: Diagnosis not present

## 2021-03-17 DIAGNOSIS — Z111 Encounter for screening for respiratory tuberculosis: Secondary | ICD-10-CM

## 2021-03-17 DIAGNOSIS — F419 Anxiety disorder, unspecified: Secondary | ICD-10-CM | POA: Diagnosis not present

## 2021-03-17 DIAGNOSIS — E7801 Familial hypercholesterolemia: Secondary | ICD-10-CM | POA: Diagnosis not present

## 2021-03-17 DIAGNOSIS — E668 Other obesity: Secondary | ICD-10-CM

## 2021-03-17 LAB — CBC WITH DIFFERENTIAL/PLATELET
Basophils Absolute: 0 10*3/uL (ref 0.0–0.1)
Basophils Relative: 0.6 % (ref 0.0–3.0)
Eosinophils Absolute: 0.1 10*3/uL (ref 0.0–0.7)
Eosinophils Relative: 1.5 % (ref 0.0–5.0)
HCT: 40.1 % (ref 36.0–46.0)
Hemoglobin: 13.3 g/dL (ref 12.0–15.0)
Lymphocytes Relative: 31.8 % (ref 12.0–46.0)
Lymphs Abs: 2.5 10*3/uL (ref 0.7–4.0)
MCHC: 33.1 g/dL (ref 30.0–36.0)
MCV: 79.3 fl (ref 78.0–100.0)
Monocytes Absolute: 0.8 10*3/uL (ref 0.1–1.0)
Monocytes Relative: 9.7 % (ref 3.0–12.0)
Neutro Abs: 4.5 10*3/uL (ref 1.4–7.7)
Neutrophils Relative %: 56.4 % (ref 43.0–77.0)
Platelets: 213 10*3/uL (ref 150.0–400.0)
RBC: 5.06 Mil/uL (ref 3.87–5.11)
RDW: 14.7 % (ref 11.5–15.5)
WBC: 7.9 10*3/uL (ref 4.0–10.5)

## 2021-03-17 LAB — TSH: TSH: 0.97 u[IU]/mL (ref 0.35–4.50)

## 2021-03-17 LAB — HEMOGLOBIN A1C: Hgb A1c MFr Bld: 5.8 % (ref 4.6–6.5)

## 2021-03-17 MED ORDER — LORAZEPAM 1 MG PO TABS: 1.0000 mg | ORAL_TABLET | ORAL | 1 refills | Status: DC | PRN

## 2021-03-17 MED ORDER — VALACYCLOVIR HCL 500 MG PO TABS: ORAL_TABLET | ORAL | 3 refills | Status: AC

## 2021-03-17 NOTE — Progress Notes (Signed)
Subjective:    Patient ID: Pamela Hall, female    DOB: October 13, 1986, 35 y.o.   MRN: [id number]  HPI Patient presents for yearly preventative medicine examination. She is a pleasant 35 year old female who  has a past medical history of Abnormal Pap smear (2008) and High cholesterol.   Dyslipidemia/Possible FH/Elevated LPA -managed by the lipid clinic.  She is on both Crestor and Zetia.  Is doing well with this combination, denies myalgia or fatigue Lab Results  Component Value Date   CHOL 221 (H) 07/30/2017   HDL 37.80 (L) 07/30/2017   LDLCALC 154 (H) 07/30/2017   TRIG 147.0 07/30/2017   CHOLHDL 6 07/30/2017   GERD - takes Protonix 40 mg PRN   Anxiety -currently prescribed Lexapro 10 mg daily. Uses Ativan PRN   Obesity - bought a pelatonin and is working on diet.  Wt Readings from Last 3 Encounters:  03/17/21 264 lb (119.7 kg)  01/04/21 262 lb 3.2 oz (118.9 kg)  12/28/20 261 lb 6.4 oz (118.6 kg)   TB screening - needs screening for work.   All immunizations and health maintenance protocols were reviewed with the patient and needed orders were placed.  Appropriate screening laboratory values were ordered for the patient including screening of hyperlipidemia, renal function and hepatic function.  Medication reconciliation,  past medical history, social history, problem list and allergies were reviewed in detail with the patient  Goals were established with regard to weight loss, exercise, and  diet in compliance with medications   Review of Systems  Constitutional: Negative.   HENT: Negative.   Eyes: Negative.   Respiratory: Negative.   Cardiovascular: Negative.   Gastrointestinal: Negative.   Endocrine: Negative.   Genitourinary: Negative.   Musculoskeletal: Negative.   Skin: Negative.   Allergic/Immunologic: Negative.   Neurological: Negative.   Hematological: Negative.   Psychiatric/Behavioral: Negative.    Past Medical History:  Diagnosis  Date  . Abnormal Pap smear 2008   COLPO;WAS NORMAL;LAST PAP 10/2011  . High cholesterol     Social History   Socioeconomic History  . Marital status: Married    Spouse name: Leeanne Rio  . Number of children: 1  . Years of education: 100  . Highest education level: Not on file  Occupational History  . Occupation: NP     Employer: [employer]  Tobacco Use  . Smoking status: Never Smoker  . Smokeless tobacco: Never Used  Vaping Use  . Vaping Use: Never used  Substance and Sexual Activity  . Alcohol use: No    Alcohol/week: 1.0 - 2.0 standard drink    Types: 1 - 2 Glasses of wine per week    Comment: SOCIAL DRINKER  . Drug use: No  . Sexual activity: Not Currently    Partners: Male    Birth control/protection: Surgical  Other Topics Concern  . Not on file  Social History Narrative  . Not on file   Social Determinants of Health   Financial Resource Strain: Not on file  Food Insecurity: Not on file  Transportation Needs: Not on file  Physical Activity: Not on file  Stress: Not on file  Social Connections: Not on file  Intimate Partner Violence: Not on file    Past Surgical History:  Procedure Laterality Date  . TUBAL LIGATION  09/30/2012   Procedure: POST PARTUM TUBAL LIGATION;  Surgeon: Michael Litter, MD;  Location: [facility];  Service: Gynecology;  Laterality: Bilateral;  . WISDOM TOOTH  EXTRACTION  2003   ALL 4 EXTRACTED    Family History  Problem Relation Age of Onset  . Diabetes type II Maternal Grandmother   . Hypertension Maternal Grandmother   . Heart disease Maternal Grandmother   . Anesthesia problems Maternal Grandmother        ALLERGIC TO GENERAL ANESTHESIA  . Other Maternal Grandmother   . Breast cancer Maternal Grandmother   . Schizophrenia Mother   . Bipolar disorder Mother   . Heart disease Mother   . Heart attack Mother     Allergies  Allergen Reactions  . Statins     Current Outpatient Medications on File Prior to Visit   Medication Sig Dispense Refill  . escitalopram (LEXAPRO) 10 MG tablet TAKE 1 TABLET(10 MG) BY MOUTH DAILY 90 tablet 1  . LORazepam (ATIVAN) 1 MG tablet Take 1 tablet (1 mg total) by mouth as needed for anxiety (for flying). 30 tablet 1  . NON FORMULARY Ashwaganda - For Stress and Anxiety    . pantoprazole (PROTONIX) 40 MG tablet Take 1 tablet (40 mg total) by mouth daily. 90 tablet 1  . rosuvastatin (CRESTOR) 20 MG tablet TAKE 1 TABLET(20 MG) BY MOUTH DAILY 90 tablet 0  . valACYclovir (VALTREX) 500 MG tablet Take 1 tablet by mouth once daily as needed. 90 tablet 3  . tranexamic acid (LYSTEDA) 650 MG TABS tablet tranexamic acid 650 mg tablet  TAKE 2 TABLETS BY MOUTH THREE TIMES DAILY AS DIRECTED     No current facility-administered medications on file prior to visit.    BP 108/80   Pulse 82   Temp 98.5 F (36.9 C) (Oral)   Ht 5' 6.75" (1.695 m)   Wt 264 lb (119.7 kg)   SpO2 97%   BMI 41.66 kg/m       Objective:   Physical Exam Vitals and nursing note reviewed.  Constitutional:      General: She is not in acute distress.    Appearance: Normal appearance. She is well-developed. She is not ill-appearing.  HENT:     Head: Normocephalic and atraumatic.     Right Ear: Tympanic membrane, ear canal and external ear normal. There is no impacted cerumen.     Left Ear: Tympanic membrane, ear canal and external ear normal. There is no impacted cerumen.     Nose: Nose normal. No congestion or rhinorrhea.     Mouth/Throat:     Mouth: Mucous membranes are moist.     Pharynx: Oropharynx is clear. No oropharyngeal exudate or posterior oropharyngeal erythema.  Eyes:     General:        Right eye: No discharge.        Left eye: No discharge.     Extraocular Movements: Extraocular movements intact.     Conjunctiva/sclera: Conjunctivae normal.     Pupils: Pupils are equal, round, and reactive to light.  Neck:     Thyroid: No thyromegaly.     Vascular: No carotid bruit.     Trachea: No  tracheal deviation.  Cardiovascular:     Rate and Rhythm: Normal rate and regular rhythm.     Pulses: Normal pulses.     Heart sounds: Normal heart sounds. No murmur heard. No friction rub. No gallop.   Pulmonary:     Effort: Pulmonary effort is normal. No respiratory distress.     Breath sounds: Normal breath sounds. No stridor. No wheezing, rhonchi or rales.  Chest:     Chest wall: No  tenderness.  Abdominal:     General: Abdomen is flat. Bowel sounds are normal. There is no distension.     Palpations: Abdomen is soft. There is no mass.     Tenderness: There is no abdominal tenderness. There is no right CVA tenderness, left CVA tenderness, guarding or rebound.     Hernia: No hernia is present.  Musculoskeletal:        General: No swelling, tenderness, deformity or signs of injury. Normal range of motion.     Cervical back: Normal range of motion and neck supple.     Right lower leg: No edema.     Left lower leg: No edema.  Lymphadenopathy:     Cervical: No cervical adenopathy.  Skin:    General: Skin is warm and dry.     Coloration: Skin is not jaundiced or pale.     Findings: No bruising, erythema, lesion or rash.  Neurological:     General: No focal deficit present.     Mental Status: She is alert and oriented to person, place, and time.     Cranial Nerves: No cranial nerve deficit.     Sensory: No sensory deficit.     Motor: No weakness.     Coordination: Coordination normal.     Gait: Gait normal.     Deep Tendon Reflexes: Reflexes normal.  Psychiatric:        Mood and Affect: Mood normal.        Behavior: Behavior normal.        Thought Content: Thought content normal.        Judgment: Judgment normal.       Assessment & Plan:  1. Routine general medical examination at a health care facility  - CBC with Differential/Platelet; Future - Comprehensive metabolic panel; Future - Hemoglobin A1c; Future - Lipid panel; Future - TSH; Future - TSH - Lipid panel -  Hemoglobin A1c - Comprehensive metabolic panel - CBC with Differential/Platelet  2. Familial hypercholesterolemia - Follow up with lipid clinic as directed - CBC with Differential/Platelet; Future - Comprehensive metabolic panel; Future - Hemoglobin A1c; Future - Lipid panel; Future - TSH; Future - TSH - Lipid panel - Hemoglobin A1c - Comprehensive metabolic panel - CBC with Differential/Platelet  3. Anxiety - Continue with Celexa  - LORazepam (ATIVAN) 1 MG tablet; Take 1 tablet (1 mg total) by mouth as needed for anxiety (for flying).  Dispense: 30 tablet; Refill: 1  4. Screening-pulmonary TB  - QuantiFERON-TB Gold Plus; Future - QuantiFERON-TB Gold Plus  5. Other obesity - Discussed medication therapy. She is interested in wegovy for weight loss. Will wait till shortage is over  - Continue with lifestyle modifications   Shirline Frees, NP

## 2021-03-19 LAB — QUANTIFERON-TB GOLD PLUS
Mitogen-NIL: >10 IU/mL
NIL: 0.04 IU/mL
QuantiFERON-TB Gold Plus: NEGATIVE
TB1-NIL: 0.03 IU/mL
TB2-NIL: 0 IU/mL

## 2021-03-20 LAB — COMPREHENSIVE METABOLIC PANEL
ALT: 15 U/L (ref 0–35)
AST: 15 U/L (ref 0–37)
Albumin: 4.2 g/dL (ref 3.5–5.2)
Alkaline Phosphatase: 72 U/L (ref 39–117)
BUN: 9 mg/dL (ref 6–23)
CO2: 22 mEq/L (ref 19–32)
Calcium: 9.1 mg/dL (ref 8.4–10.5)
Chloride: 103 mEq/L (ref 96–112)
Creatinine, Ser: 0.73 mg/dL (ref 0.40–1.20)
GFR: 105.74 mL/min (ref >60.00)
Glucose, Bld: 88 mg/dL (ref 70–99)
Potassium: 4.2 mEq/L (ref 3.5–5.1)
Sodium: 136 mEq/L (ref 135–145)
Total Bilirubin: 0.4 mg/dL (ref 0.2–1.2)
Total Protein: 7 g/dL (ref 6.0–8.3)

## 2021-03-20 LAB — LIPID PANEL
Cholesterol: 142 mg/dL (ref 0–200)
HDL: 40.5 mg/dL (ref >39.00)
LDL Cholesterol: 80 mg/dL (ref 0–99)
NonHDL: 101.2
Total CHOL/HDL Ratio: 3
Triglycerides: 105 mg/dL (ref 0.0–149.0)
VLDL: 21 mg/dL (ref 0.0–40.0)

## 2021-05-10 ENCOUNTER — Other Ambulatory Visit: Payer: Self-pay | Admitting: Internal Medicine

## 2021-05-10 DIAGNOSIS — E7801 Familial hypercholesterolemia: Secondary | ICD-10-CM

## 2021-05-12 LAB — HM PAP SMEAR: HM Pap smear: NEGATIVE

## 2021-06-08 ENCOUNTER — Encounter: Payer: Self-pay | Admitting: Adult Health

## 2021-06-08 ENCOUNTER — Telehealth (INDEPENDENT_AMBULATORY_CARE_PROVIDER_SITE_OTHER): Payer: No Typology Code available for payment source | Admitting: Adult Health

## 2021-06-08 DIAGNOSIS — R059 Cough, unspecified: Secondary | ICD-10-CM | POA: Diagnosis not present

## 2021-06-08 DIAGNOSIS — J0141 Acute recurrent pansinusitis: Secondary | ICD-10-CM

## 2021-06-08 MED ORDER — AMOXICILLIN 500 MG PO TABS: 500.0000 mg | ORAL_TABLET | Freq: Two times a day (BID) | ORAL | 0 refills | Status: AC

## 2021-06-08 MED ORDER — FLUCONAZOLE 150 MG PO TABS: 150.0000 mg | ORAL_TABLET | Freq: Once | ORAL | 0 refills | Status: AC

## 2021-06-08 MED ORDER — BENZONATATE 200 MG PO CAPS: 200.0000 mg | ORAL_CAPSULE | Freq: Three times a day (TID) | ORAL | 0 refills | Status: DC | PRN

## 2021-06-08 MED ORDER — HYDROCODONE BIT-HOMATROP MBR 5-1.5 MG/5ML PO SOLN: 5.0000 mL | Freq: Three times a day (TID) | ORAL | 0 refills | Status: DC | PRN

## 2021-06-08 NOTE — Progress Notes (Signed)
Virtual Visit via Video Note  I connected with Pamela Hall on 06/08/21 at 11:00 AM EDT by a video enabled telemedicine application and verified that I am speaking with the correct person using two identifiers.  Location patient: home Location provider:work or home office Persons participating in the virtual visit: patient, provider  I discussed the limitations of evaluation and management by telemedicine and the availability of in person appointments. The patient expressed understanding and agreed to proceed.   HPI: D89-year-old female who is being evaluated today for sinusitis and cough.  She was diagnosed with COVID-19 on 05/27/2022.  Symptoms were mild with fatigue and nonproductive cough.  When she recovered she developed sinus pain and pressure, discolored nasal drainage, "musty smell in her nose" and frontal headache.  She denies fevers or chills.  Feels as though this is her typical sinus infection that she gets periodically.  Symptoms seem to be getting worse over the last week.  Cough is keeping her up at night   ROS: See pertinent positives and negatives per HPI.  Past Medical History:  Diagnosis Date   Abnormal Pap smear 2008   COLPO;WAS NORMAL;LAST PAP 10/2011   High cholesterol     Past Surgical History:  Procedure Laterality Date   TUBAL LIGATION  09/30/2012   Procedure: POST PARTUM TUBAL LIGATION;  Surgeon: Michael Litter, MD;  Location: [facility];  Service: Gynecology;  Laterality: Bilateral;   WISDOM TOOTH EXTRACTION  2003   ALL 4 EXTRACTED    Family History  Problem Relation Age of Onset   Diabetes type II Maternal Grandmother    Hypertension Maternal Grandmother    Heart disease Maternal Grandmother    Anesthesia problems Maternal Grandmother        ALLERGIC TO GENERAL ANESTHESIA   Other Maternal Grandmother    Breast cancer Maternal Grandmother    Schizophrenia Mother    Bipolar disorder Mother    Heart disease Mother    Heart attack Mother         Current Outpatient Medications:    escitalopram (LEXAPRO) 10 MG tablet, TAKE 1 TABLET(10 MG) BY MOUTH DAILY, Disp: 90 tablet, Rfl: 1   LORazepam (ATIVAN) 1 MG tablet, Take 1 tablet (1 mg total) by mouth as needed for anxiety (for flying)., Disp: 30 tablet, Rfl: 1   NON FORMULARY, Ashwaganda - For Stress and Anxiety, Disp: , Rfl:    pantoprazole (PROTONIX) 40 MG tablet, Take 1 tablet (40 mg total) by mouth daily., Disp: 90 tablet, Rfl: 1   rosuvastatin (CRESTOR) 20 MG tablet, PATIENT NEED TO SCHEDULE APPOINTMENT FOR FUTURE REFILLS. 1ST ATTEMPT. TAKE 1 TABLET(20 MG) BY MOUTH DAILY, Disp: 30 tablet, Rfl: 0   tranexamic acid (LYSTEDA) 650 MG TABS tablet, tranexamic acid 650 mg tablet  TAKE 2 TABLETS BY MOUTH THREE TIMES DAILY AS DIRECTED, Disp: , Rfl:    valACYclovir (VALTREX) 500 MG tablet, Take 1 tablet by mouth once daily as needed., Disp: 90 tablet, Rfl: 3  EXAM:  VITALS per patient if applicable:  GENERAL: alert, oriented, appears well and in no acute distress  HEENT: atraumatic, conjunttiva clear, no obvious abnormalities on inspection of external nose and ears  NECK: normal movements of the head and neck  LUNGS: on inspection no signs of respiratory distress, breathing rate appears normal, no obvious gross SOB, gasping or wheezing  CV: no obvious cyanosis  MS: moves all visible extremities without noticeable abnormality  PSYCH/NEURO: pleasant and cooperative, no obvious depression or anxiety, speech and  thought processing grossly intact  ASSESSMENT AND PLAN:  Discussed the following assessment and plan:  1. Acute recurrent pansinusitis -Treat with amoxicillin for worsening symptoms.  Advise follow-up if no improvement over the next 2 to 3 days - amoxicillin (AMOXIL) 500 MG tablet; Take 1 tablet (500 mg total) by mouth 2 (two) times daily for 10 days.  Dispense: 20 tablet; Refill: 0 - fluconazole (DIFLUCAN) 150 MG tablet; Take 1 tablet (150 mg total) by mouth once for  1 dose. Second dose if needed 3 days later  Dispense: 2 tablet; Refill: 0  2. Cough  - HYDROcodone bit-homatropine (HYCODAN) 5-1.5 MG/5ML syrup; Take 5 mLs by mouth every 8 (eight) hours as needed for cough.  Dispense: 120 mL; Refill: 0 - benzonatate (TESSALON) 200 MG capsule; Take 1 capsule (200 mg total) by mouth 3 (three) times daily as needed for cough.  Dispense: 30 capsule; Refill: 0     I discussed the assessment and treatment plan with the patient. The patient was provided an opportunity to ask questions and all were answered. The patient agreed with the plan and demonstrated an understanding of the instructions.   The patient was advised to call back or seek an in-person evaluation if the symptoms worsen or if the condition fails to improve as anticipated.   Shirline Frees, NP

## 2021-06-21 ENCOUNTER — Other Ambulatory Visit: Payer: Self-pay | Admitting: Internal Medicine

## 2021-06-21 DIAGNOSIS — E7801 Familial hypercholesterolemia: Secondary | ICD-10-CM

## 2021-06-24 ENCOUNTER — Other Ambulatory Visit: Payer: Self-pay | Admitting: Adult Health

## 2021-06-24 DIAGNOSIS — F419 Anxiety disorder, unspecified: Secondary | ICD-10-CM

## 2021-06-27 IMAGING — MG DIGITAL SCREENING BILAT W/ TOMO W/ CAD
8 series · 8 of 24 positions shown · non-contrast
Comparison: None.

CLINICAL DATA: Screening.

EXAM:
DIGITAL SCREENING BILATERAL MAMMOGRAM WITH TOMO AND CAD

[L CC synth-2D]
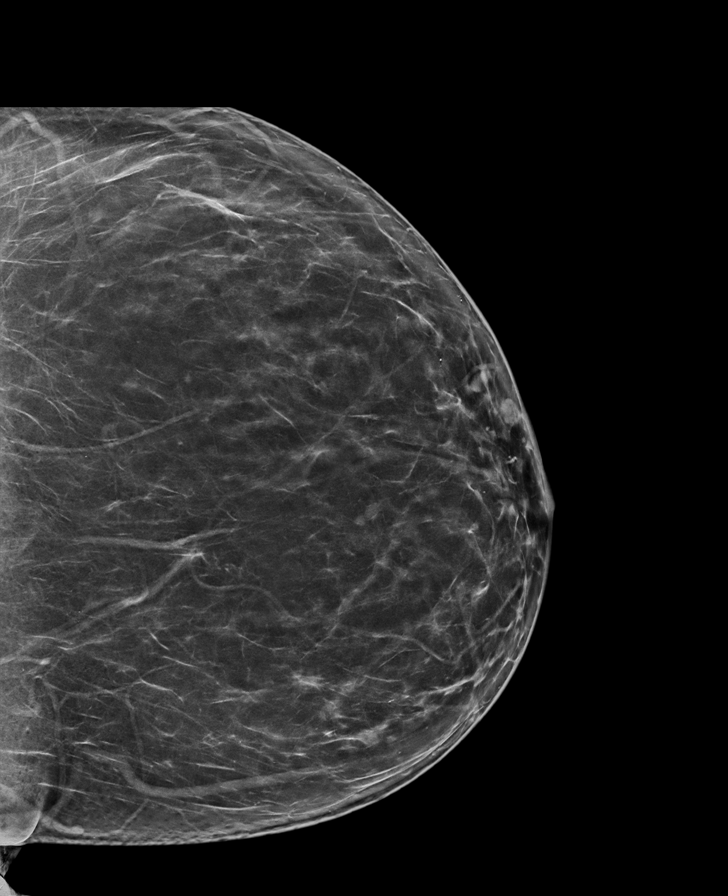

[R MLO synth-2D]
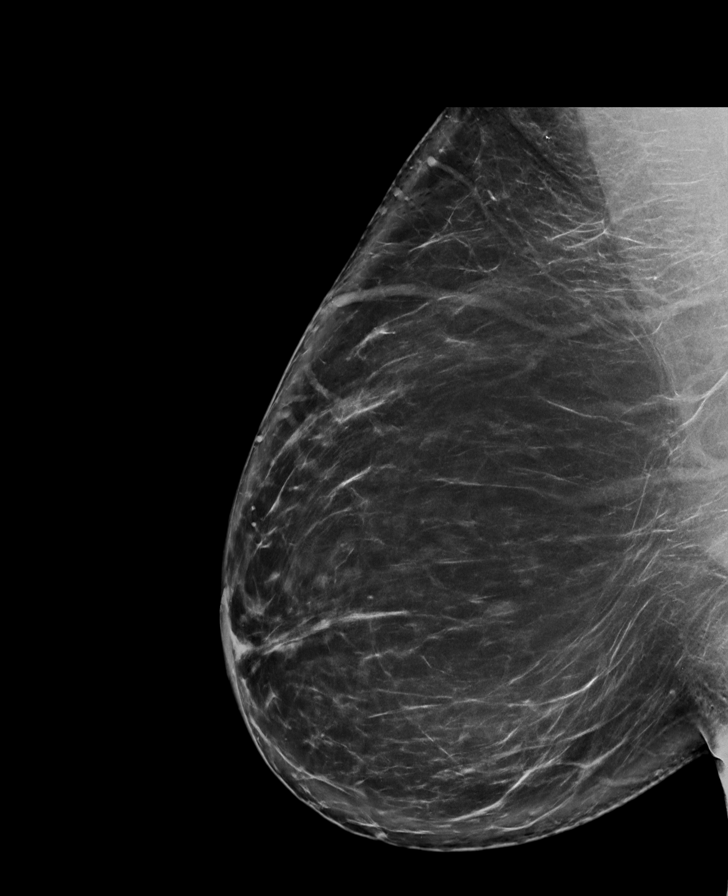

[R CC synth-2D]
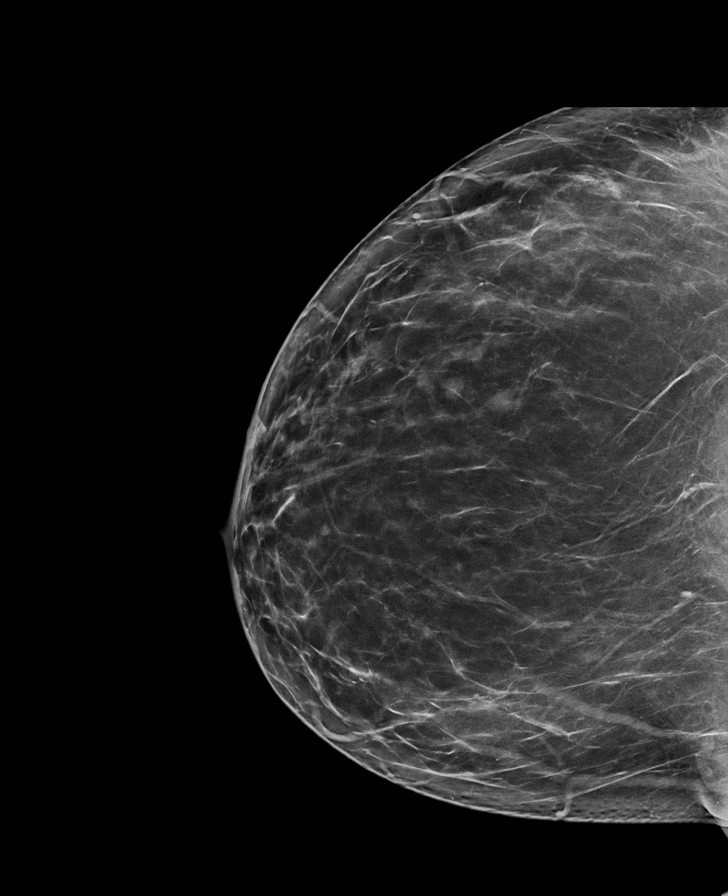

[L MLO synth-2D]
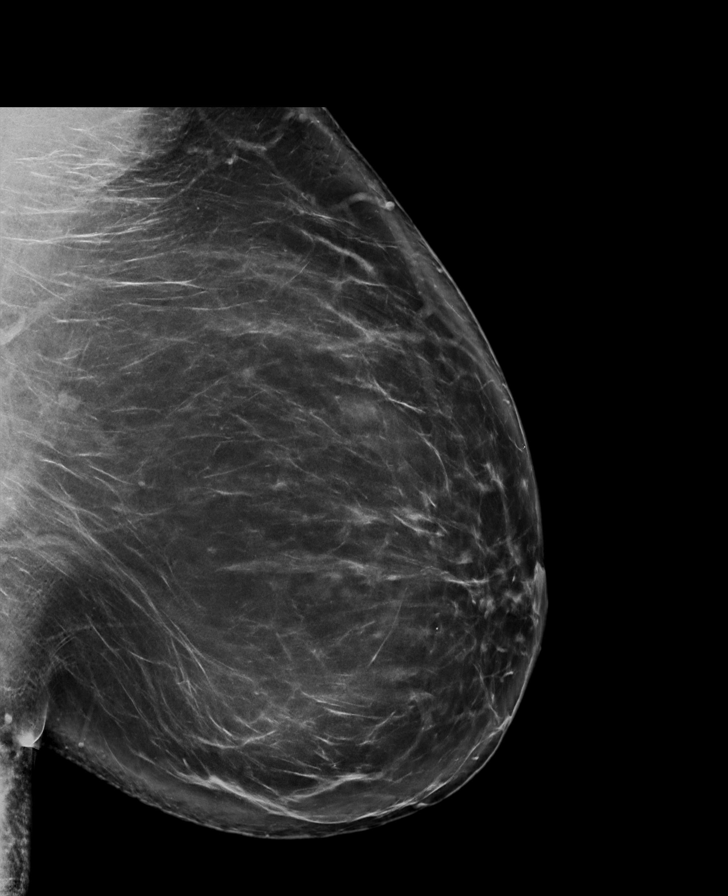

[L CC tomo · tomo slice 45/88.0]
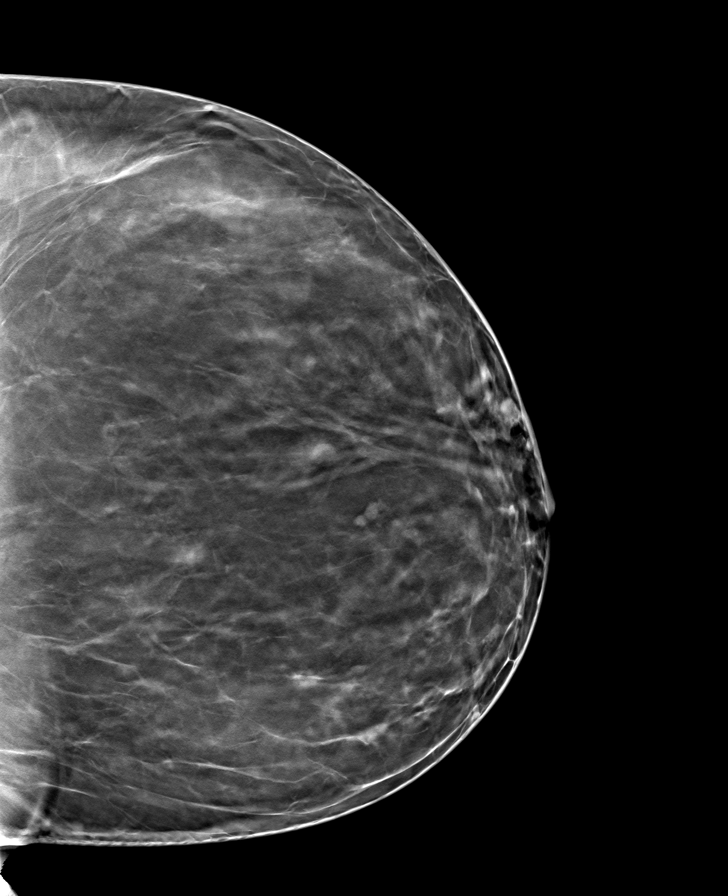

[R CC tomo · tomo slice 45/88.0]
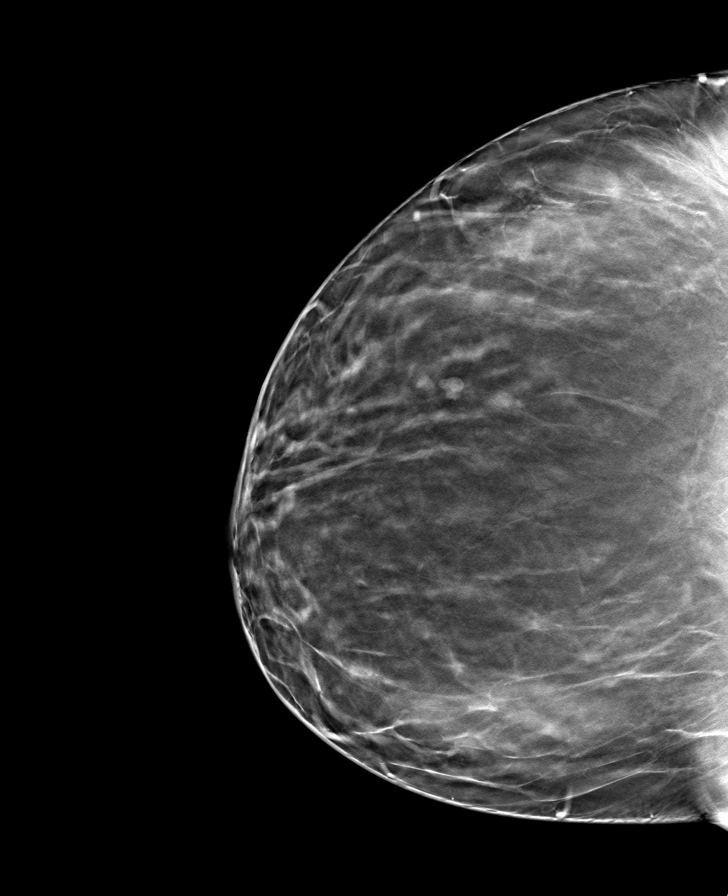

[R MLO tomo · tomo slice 54/107.0]
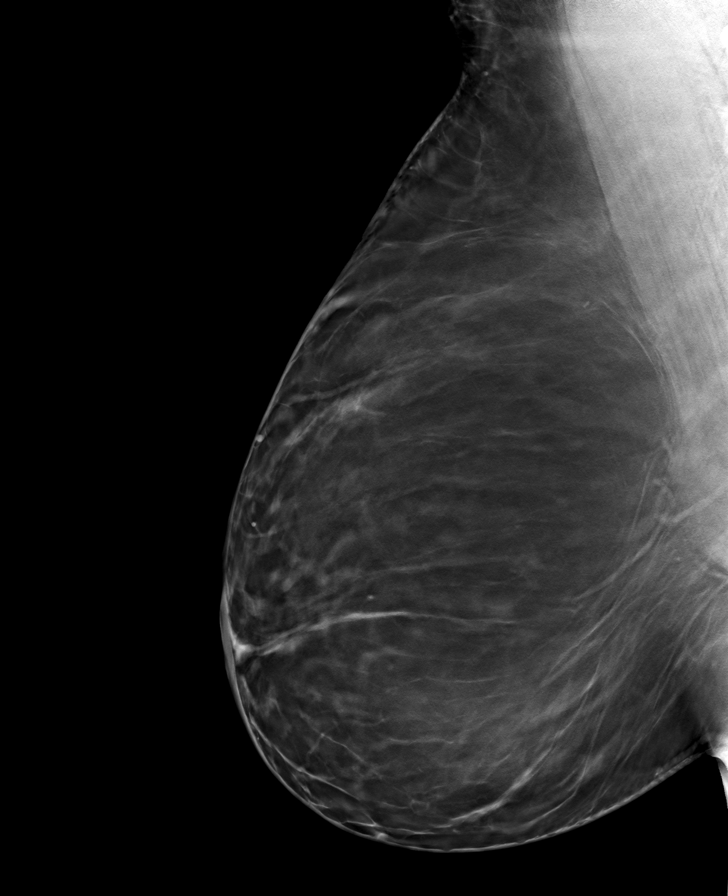

[L MLO tomo · tomo slice 55/108.0]
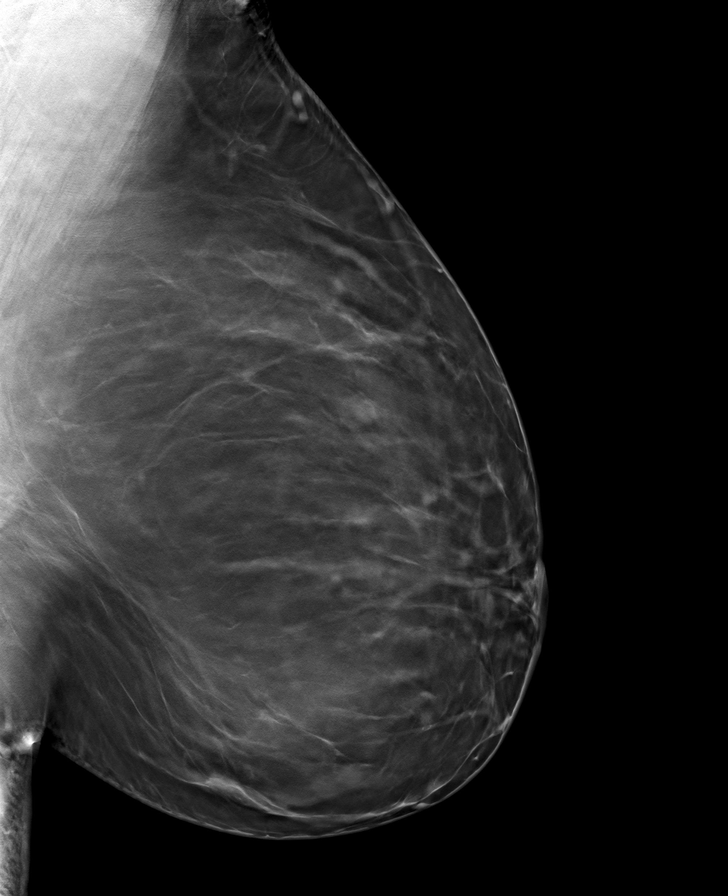

[8 of 24 positions shown; findings below may reference images not displayed]

ACR Breast Density Category b: There are scattered areas of
fibroglandular density.
FINDINGS: In the right breast masses requires further evaluation.

In the left breast a mass requires further evaluation.

Images were processed with CAD.
IMPRESSION: Further evaluation is suggested for possible masses in the right
breast.

Further evaluation is suggested for possible a mass in the left
breast.

RECOMMENDATION:
Diagnostic mammogram and possibly ultrasound of both breasts.
(Code:[id number]-W-33K)

The patient will be contacted regarding the findings, and additional
imaging will be scheduled.

BI-RADS CATEGORY  0: Incomplete. Need additional imaging evaluation
and/or prior mammograms for comparison.

## 2021-08-13 ENCOUNTER — Encounter: Payer: Self-pay | Admitting: Adult Health

## 2021-08-15 ENCOUNTER — Other Ambulatory Visit: Payer: Self-pay | Admitting: Adult Health

## 2021-08-15 DIAGNOSIS — F419 Anxiety disorder, unspecified: Secondary | ICD-10-CM

## 2021-08-28 ENCOUNTER — Other Ambulatory Visit: Payer: Self-pay | Admitting: Obstetrics and Gynecology

## 2021-09-11 ENCOUNTER — Other Ambulatory Visit: Payer: Self-pay | Admitting: Internal Medicine

## 2021-09-11 DIAGNOSIS — E7801 Familial hypercholesterolemia: Secondary | ICD-10-CM

## 2021-09-15 ENCOUNTER — Other Ambulatory Visit: Payer: Self-pay | Admitting: Obstetrics and Gynecology

## 2021-09-26 ENCOUNTER — Encounter: Payer: Self-pay | Admitting: Adult Health

## 2021-09-26 ENCOUNTER — Other Ambulatory Visit: Payer: Self-pay | Admitting: Adult Health

## 2021-09-27 ENCOUNTER — Ambulatory Visit (HOSPITAL_BASED_OUTPATIENT_CLINIC_OR_DEPARTMENT_OTHER): Admit: 2021-09-27 | Payer: No Typology Code available for payment source | Admitting: Obstetrics and Gynecology

## 2021-09-27 ENCOUNTER — Encounter (HOSPITAL_BASED_OUTPATIENT_CLINIC_OR_DEPARTMENT_OTHER): Payer: Self-pay

## 2021-09-27 SURGERY — DILATATION & CURETTAGE/HYSTEROSCOPY WITH NOVASURE ABLATION
Anesthesia: Choice

## 2021-10-15 HISTORY — PX: HYSTEROSCOPY W/ ENDOMETRIAL ABLATION: SUR665

## 2021-11-09 ENCOUNTER — Encounter: Payer: Self-pay | Admitting: Adult Health

## 2021-11-09 ENCOUNTER — Ambulatory Visit (INDEPENDENT_AMBULATORY_CARE_PROVIDER_SITE_OTHER): Payer: No Typology Code available for payment source | Admitting: Adult Health

## 2021-11-09 VITALS — BP 124/62 | HR 92 | Temp 98.4°F | Wt 263.6 lb

## 2021-11-09 DIAGNOSIS — M25561 Pain in right knee: Secondary | ICD-10-CM

## 2021-11-09 DIAGNOSIS — M79605 Pain in left leg: Secondary | ICD-10-CM

## 2021-11-09 DIAGNOSIS — G8929 Other chronic pain: Secondary | ICD-10-CM

## 2021-11-09 DIAGNOSIS — M79604 Pain in right leg: Secondary | ICD-10-CM | POA: Diagnosis not present

## 2021-11-09 MED ORDER — WEGOVY 0.25 MG/0.5ML ~~LOC~~ SOAJ: 0.2500 mg | SUBCUTANEOUS | 0 refills | Status: DC

## 2021-11-09 NOTE — Progress Notes (Signed)
Subjective:    Patient ID: Pamela Hall, female    DOB: 04-19-1986, 36 y.o.   MRN: [id number]  HPI 36 year old female who  has a past medical history of Abnormal Pap smear (2008) and High cholesterol.  She presents to the office today for continued right knee pain as well as bilateral leg pain.  Her right knee pain started roughly 2 years ago.  X-ray showed mild degenerative changes.  She did have a steroid injection in March 2022 which provided short-lived relief.  She continues to have the discomfort most days.  Pain is present with change in positions, sitting, and walking.  Additionally she reports that over the last 2 months she has had pain in both of her legs in her thighs and behind her calf down to her ankle.  Pain is described as aching pain.  She has tried Motrin and Flexeril that did not improve the discomfort.  With this pain she feels as though she feels unsteady with gait especially when sitting down and standing up.  She did stop her statin medication about 2 days ago and feels as though she has had some minor improvement.  He really has no discomfort today in her legs  Has been no trauma or aggravating injury.  She rides a palatine about 2 times a week.  Wt Readings from Last 3 Encounters:  11/09/21 263 lb 9.6 oz (119.6 kg)  03/17/21 264 lb (119.7 kg)  01/04/21 262 lb 3.2 oz (118.9 kg)   Review of Systems See HPI   Past Medical History:  Diagnosis Date   Abnormal Pap smear 2008   COLPO;WAS NORMAL;LAST PAP 10/2011   High cholesterol     Social History   Socioeconomic History   Marital status: Married    Spouse name: Lucia Gaskins   Number of children: 1   Years of education: 17   Highest education level: Not on file  Occupational History   Occupation: NP     Employer: [employer]  Tobacco Use   Smoking status: Never   Smokeless tobacco: Never  Vaping Use   Vaping Use: Never used  Substance and Sexual Activity   Alcohol use: No     Alcohol/week: 1.0 - 2.0 standard drink    Types: 1 - 2 Glasses of wine per week    Comment: SOCIAL DRINKER   Drug use: No   Sexual activity: Not Currently    Partners: Male    Birth control/protection: Surgical  Other Topics Concern   Not on file  Social History Narrative   Not on file   Social Determinants of Health   Financial Resource Strain: Not on file  Food Insecurity: Not on file  Transportation Needs: Not on file  Physical Activity: Not on file  Stress: Not on file  Social Connections: Not on file  Intimate Partner Violence: Not on file    Past Surgical History:  Procedure Laterality Date   TUBAL LIGATION  09/30/2012   Procedure: POST PARTUM TUBAL LIGATION;  Surgeon: Betsy Coder, MD;  Location: [facility];  Service: Gynecology;  Laterality: Bilateral;   WISDOM TOOTH EXTRACTION  2003   ALL 4 EXTRACTED    Family History  Problem Relation Age of Onset   Diabetes type II Maternal Grandmother    Hypertension Maternal Grandmother    Heart disease Maternal Grandmother    Anesthesia problems Maternal Grandmother        ALLERGIC TO GENERAL ANESTHESIA  Other Maternal Grandmother    Breast cancer Maternal Grandmother    Schizophrenia Mother    Bipolar disorder Mother    Heart disease Mother    Heart attack Mother     Allergies  Allergen Reactions   Statins     Current Outpatient Medications on File Prior to Visit  Medication Sig Dispense Refill   benzonatate (TESSALON) 200 MG capsule Take 1 capsule (200 mg total) by mouth 3 (three) times daily as needed for cough. 30 capsule 0   escitalopram (LEXAPRO) 10 MG tablet TAKE 1 TABLET(10 MG) BY MOUTH DAILY 90 tablet 1   HYDROcodone bit-homatropine (HYCODAN) 5-1.5 MG/5ML syrup Take 5 mLs by mouth every 8 (eight) hours as needed for cough. 120 mL 0   LORazepam (ATIVAN) 1 MG tablet Take 1 tablet (1 mg total) by mouth as needed for anxiety (for flying). 30 tablet 1   NON FORMULARY Ashwaganda - For Stress and Anxiety      pantoprazole (PROTONIX) 40 MG tablet Take 1 tablet (40 mg total) by mouth daily. 90 tablet 1   rosuvastatin (CRESTOR) 20 MG tablet TAKE 1 TABLET(20 MG) BY MOUTH DAILY 30 tablet 1   tranexamic acid (LYSTEDA) 650 MG TABS tablet tranexamic acid 650 mg tablet  TAKE 2 TABLETS BY MOUTH THREE TIMES DAILY AS DIRECTED     valACYclovir (VALTREX) 500 MG tablet Take 1 tablet by mouth once daily as needed. 90 tablet 3   No current facility-administered medications on file prior to visit.    BP 124/62 (BP Location: Left Arm, Patient Position: Sitting, Cuff Size: Large)    Pulse 92    Temp 98.4 F (36.9 C) (Oral)    Wt 263 lb 9.6 oz (119.6 kg)    SpO2 98%    BMI 41.60 kg/m       Objective:   Physical Exam Vitals and nursing note reviewed.  Constitutional:      Appearance: Normal appearance. She is obese.  Musculoskeletal:        General: Tenderness present. No swelling.     Right knee: Bony tenderness present. No swelling or crepitus.     Comments: Has mild muscle tenderness with palpation to upper thighs.  No Baker's cyst or signs of DVT noted in bilateral legs.  Skin:    General: Skin is warm and dry.     Capillary Refill: Capillary refill takes less than 2 seconds.  Neurological:     General: No focal deficit present.     Mental Status: She is alert and oriented to person, place, and time.  Psychiatric:        Mood and Affect: Mood normal.        Behavior: Behavior normal.        Thought Content: Thought content normal.        Judgment: Judgment normal.       Assessment & Plan:  1. Chronic pain of right knee -We will get MRI of the right knee due to constant pain.  She would likely benefit from weight loss, we will try getting Wegovy covered for her. - MR Knee Right Wo Contrast; Future -Consider referral to orthopedics 2. Morbid obesity ([place])  - Semaglutide-Weight Management (WEGOVY) 0.25 MG/0.5ML SOAJ; Inject 0.25 mg into the skin once a week.  Dispense: 2 mL; Refill: 0  3.  Bilateral leg pain -Seems to be more muscular in origin.  She can stay off her statin for 38 weeks.  If pain resolves then follow-up with her  cardiologist.  If pain continues and follow-up here.  Advised stretching exercises.  Dorothyann Peng, NP

## 2021-11-16 ENCOUNTER — Encounter: Payer: Self-pay | Admitting: Adult Health

## 2021-11-17 ENCOUNTER — Telehealth: Payer: Self-pay

## 2021-11-17 NOTE — Telephone Encounter (Signed)
Evalisse Doles-Charna Neeb (Key: [id number]) Rx #: [id number] 0.25MG /0.5ML auto-injectors  Form [employer] Electronic Prior Authorization Form (2017 NCPDP) Created 4 days ago Sent to Plan 20 hours ago Plan Response 20 hours ago Submit Clinical Questions 19 hours ago Determination Unfavorable 19 hours ago   Your prior authorization request has been denied.

## 2021-11-21 NOTE — Telephone Encounter (Signed)
Patient notified of update  and verbalized understanding. Pt will call insurance and call us back.

## 2021-11-22 NOTE — Telephone Encounter (Signed)
Belenda Cruise from cover my meds called to see if we had received appeal information for patients PA for [facility]. I let her know that it had probably been filed but I was not the one who filed it. She asked if [employer] planned on appealing the denial. I told her that it looked like they may be looking into alternative that insurance does cover but I would leave a number in case they wanted to speak with her about filing an appeal.    Please advise

## 2021-11-23 ENCOUNTER — Other Ambulatory Visit: Payer: Self-pay | Admitting: Internal Medicine

## 2021-11-23 DIAGNOSIS — E7801 Familial hypercholesterolemia: Secondary | ICD-10-CM

## 2021-11-23 NOTE — Telephone Encounter (Signed)
Filed an appeal this morning. Will contact pt after results come in.

## 2021-11-27 ENCOUNTER — Encounter: Payer: Self-pay | Admitting: Adult Health

## 2021-11-27 DIAGNOSIS — F419 Anxiety disorder, unspecified: Secondary | ICD-10-CM

## 2021-11-29 MED ORDER — ESCITALOPRAM OXALATE 10 MG PO TABS: ORAL_TABLET | ORAL | 0 refills | Status: DC

## 2022-02-01 ENCOUNTER — Telehealth (INDEPENDENT_AMBULATORY_CARE_PROVIDER_SITE_OTHER): Payer: No Typology Code available for payment source | Admitting: Adult Health

## 2022-02-01 ENCOUNTER — Encounter: Payer: Self-pay | Admitting: Adult Health

## 2022-02-01 DIAGNOSIS — F419 Anxiety disorder, unspecified: Secondary | ICD-10-CM

## 2022-02-01 MED ORDER — LORAZEPAM 1 MG PO TABS: 1.0000 mg | ORAL_TABLET | ORAL | 0 refills | Status: DC | PRN

## 2022-02-01 NOTE — Progress Notes (Signed)
Virtual Visit via Video Note ? ?I connected with Pamela Hall  on 02/01/22 at 11:30 AM EDT by a video enabled telemedicine application and verified that I am speaking with the correct person using two identifiers. ? Location patient: home ?Location provider:work or home office ?Persons participating in the virtual visit: patient, provider ? ?I discussed the limitations of evaluation and management by telemedicine and the availability of in person appointments. The patient expressed understanding and agreed to proceed. ? ? ?HPI: ?36 year old female who  has a past medical history of Abnormal Pap smear (2008) and High cholesterol. ? ?She is being evaluated today for anxiety.  She is currently prescribed Lexapro 10 mg daily.  She takes Ativan 1 mg as needed when flying.  She does report that her anxiety has been higher since 12-13-2022 when her mom died unexpectedly.  She is seeing a therapist and is finding this helpful.  She is taking a 2-week vacation going to [place].  She needs a refill of her Ativan. ? ?ROS: See pertinent positives and negatives per HPI. ? ?Past Medical History:  ?Diagnosis Date  ? Abnormal Pap smear 2008  ? COLPO;WAS NORMAL;LAST PAP 10/2011  ? High cholesterol   ? ? ?Past Surgical History:  ?Procedure Laterality Date  ? TUBAL LIGATION  09/30/2012  ? Procedure: POST PARTUM TUBAL LIGATION;  Surgeon: Betsy Coder, MD;  Location: [facility];  Service: Gynecology;  Laterality: Bilateral;  ? WISDOM TOOTH EXTRACTION  2003  ? ALL 4 EXTRACTED  ? ? ?Family History  ?Problem Relation Age of Onset  ? Diabetes type II Maternal Grandmother   ? Hypertension Maternal Grandmother   ? Heart disease Maternal Grandmother   ? Anesthesia problems Maternal Grandmother   ?     ALLERGIC TO GENERAL ANESTHESIA  ? Other Maternal Grandmother   ? Breast cancer Maternal Grandmother   ? Schizophrenia Mother   ? Bipolar disorder Mother   ? Heart disease Mother   ? Heart attack Mother   ? ? ? ? ? ?Current Outpatient  Medications:  ?  escitalopram (LEXAPRO) 10 MG tablet, TAKE 1 TABLET(10 MG) BY MOUTH DAILY, Disp: 90 tablet, Rfl: 0 ?  NON FORMULARY, Ashwaganda - For Stress and Anxiety, Disp: , Rfl:  ?  pantoprazole (PROTONIX) 40 MG tablet, Take 1 tablet (40 mg total) by mouth daily., Disp: 90 tablet, Rfl: 1 ?  rosuvastatin (CRESTOR) 20 MG tablet, Take 1 tablet (20 mg total) by mouth daily. Schedule an appointment with cardiology for future refills. 1st attempt, Disp: 30 tablet, Rfl: 0 ?  tranexamic acid (LYSTEDA) 650 MG TABS tablet, tranexamic acid 650 mg tablet  TAKE 2 TABLETS BY MOUTH THREE TIMES DAILY AS DIRECTED, Disp: , Rfl:  ?  valACYclovir (VALTREX) 500 MG tablet, Take 1 tablet by mouth once daily as needed., Disp: 90 tablet, Rfl: 3 ?  LORazepam (ATIVAN) 1 MG tablet, Take 1 tablet (1 mg total) by mouth as needed for anxiety (for flying)., Disp: 30 tablet, Rfl: 0 ? ?EXAM: ? ?VITALS per patient if applicable: ? ?GENERAL: alert, oriented, appears well and in no acute distress ? ?HEENT: atraumatic, conjunttiva clear, no obvious abnormalities on inspection of external nose and ears ? ?NECK: normal movements of the head and neck ? ?LUNGS: on inspection no signs of respiratory distress, breathing rate appears normal, no obvious gross SOB, gasping or wheezing ? ?CV: no obvious cyanosis ? ?MS: moves all visible extremities without noticeable abnormality ? ?PSYCH/NEURO: pleasant and cooperative, no  obvious depression or anxiety, speech and thought processing grossly intact ? ?ASSESSMENT AND PLAN: ? ?Discussed the following assessment and plan: ? ?Anxiety - Plan: LORazepam (ATIVAN) 1 MG tablet ?-We discussed increasing her Lexapro dose at this time, she would like to hold on this for the time being.  We will reevaluate at her upcoming physical. ? ?  ?I discussed the assessment and treatment plan with the patient. The patient was provided an opportunity to ask questions and all were answered. The patient agreed with the plan and  demonstrated an understanding of the instructions. ?  ?The patient was advised to call back or seek an in-person evaluation if the symptoms worsen or if the condition fails to improve as anticipated. ? ? ?Dorothyann Peng, NP  ? ?

## 2022-02-06 ENCOUNTER — Telehealth: Payer: No Typology Code available for payment source | Admitting: Adult Health

## 2022-03-05 ENCOUNTER — Other Ambulatory Visit: Payer: Self-pay | Admitting: Adult Health

## 2022-03-05 DIAGNOSIS — F419 Anxiety disorder, unspecified: Secondary | ICD-10-CM

## 2022-04-03 ENCOUNTER — Encounter: Payer: Self-pay | Admitting: Adult Health

## 2022-04-03 ENCOUNTER — Ambulatory Visit (INDEPENDENT_AMBULATORY_CARE_PROVIDER_SITE_OTHER): Payer: No Typology Code available for payment source | Admitting: Adult Health

## 2022-04-03 ENCOUNTER — Telehealth: Payer: Self-pay | Admitting: Adult Health

## 2022-04-03 VITALS — BP 120/60 | HR 60 | Temp 98.3°F | Ht 67.0 in | Wt 261.0 lb

## 2022-04-03 DIAGNOSIS — Z0001 Encounter for general adult medical examination with abnormal findings: Secondary | ICD-10-CM

## 2022-04-03 DIAGNOSIS — G4730 Sleep apnea, unspecified: Secondary | ICD-10-CM

## 2022-04-03 DIAGNOSIS — F419 Anxiety disorder, unspecified: Secondary | ICD-10-CM | POA: Diagnosis not present

## 2022-04-03 DIAGNOSIS — Z1159 Encounter for screening for other viral diseases: Secondary | ICD-10-CM

## 2022-04-03 DIAGNOSIS — E7801 Familial hypercholesterolemia: Secondary | ICD-10-CM | POA: Diagnosis not present

## 2022-04-03 DIAGNOSIS — T148XXA Other injury of unspecified body region, initial encounter: Secondary | ICD-10-CM | POA: Diagnosis not present

## 2022-04-03 LAB — CBC WITH DIFFERENTIAL/PLATELET
Basophils Absolute: 0 10*3/uL (ref 0.0–0.1)
Basophils Relative: 0.5 % (ref 0.0–3.0)
Eosinophils Absolute: 0.1 10*3/uL (ref 0.0–0.7)
Eosinophils Relative: 2.1 % (ref 0.0–5.0)
HCT: 39.1 % (ref 36.0–46.0)
Hemoglobin: 12.6 g/dL (ref 12.0–15.0)
Lymphocytes Relative: 42.4 % (ref 12.0–46.0)
Lymphs Abs: 2.4 10*3/uL (ref 0.7–4.0)
MCHC: 32.2 g/dL (ref 30.0–36.0)
MCV: 73.9 fl — ABNORMAL LOW (ref 78.0–100.0)
Monocytes Absolute: 0.6 10*3/uL (ref 0.1–1.0)
Monocytes Relative: 10.8 % (ref 3.0–12.0)
Neutro Abs: 2.4 10*3/uL (ref 1.4–7.7)
Neutrophils Relative %: 44.2 % (ref 43.0–77.0)
Platelets: 237 10*3/uL (ref 150.0–400.0)
RBC: 5.29 Mil/uL — ABNORMAL HIGH (ref 3.87–5.11)
RDW: 16.8 % — ABNORMAL HIGH (ref 11.5–15.5)
WBC: 5.5 10*3/uL (ref 4.0–10.5)

## 2022-04-03 LAB — COMPREHENSIVE METABOLIC PANEL
ALT: 11 U/L (ref 0–35)
AST: 13 U/L (ref 0–37)
Albumin: 3.9 g/dL (ref 3.5–5.2)
Alkaline Phosphatase: 72 U/L (ref 39–117)
BUN: 8 mg/dL (ref 6–23)
CO2: 27 mEq/L (ref 19–32)
Calcium: 9 mg/dL (ref 8.4–10.5)
Chloride: 103 mEq/L (ref 96–112)
Creatinine, Ser: 0.73 mg/dL (ref 0.40–1.20)
GFR: 104.97 mL/min (ref >60.00)
Glucose, Bld: 102 mg/dL — ABNORMAL HIGH (ref 70–99)
Potassium: 4 mEq/L (ref 3.5–5.1)
Sodium: 136 mEq/L (ref 135–145)
Total Bilirubin: 0.2 mg/dL (ref 0.2–1.2)
Total Protein: 7.4 g/dL (ref 6.0–8.3)

## 2022-04-03 LAB — IBC + FERRITIN
Ferritin: 10.7 ng/mL (ref 10.0–291.0)
Iron: 42 ug/dL (ref 42–145)
Saturation Ratios: 10 % — ABNORMAL LOW (ref 20.0–50.0)
TIBC: 420 ug/dL (ref 250.0–450.0)
Transferrin: 300 mg/dL (ref 212.0–360.0)

## 2022-04-03 LAB — VITAMIN D 25 HYDROXY (VIT D DEFICIENCY, FRACTURES): VITD: 17.96 ng/mL — ABNORMAL LOW (ref 30.00–100.00)

## 2022-04-03 LAB — VITAMIN B12: Vitamin B-12: 323 pg/mL (ref 211–911)

## 2022-04-03 LAB — LIPID PANEL
Cholesterol: 241 mg/dL — ABNORMAL HIGH (ref 0–200)
HDL: 34.2 mg/dL — ABNORMAL LOW (ref >39.00)
LDL Cholesterol: 170 mg/dL — ABNORMAL HIGH (ref 0–99)
NonHDL: 206.93
Total CHOL/HDL Ratio: 7
Triglycerides: 187 mg/dL — ABNORMAL HIGH (ref 0.0–149.0)
VLDL: 37.4 mg/dL (ref 0.0–40.0)

## 2022-04-03 LAB — TSH: TSH: 1.21 u[IU]/mL (ref 0.35–5.50)

## 2022-04-03 LAB — HEMOGLOBIN A1C: Hgb A1c MFr Bld: 5.9 % (ref 4.6–6.5)

## 2022-04-03 MED ORDER — VITAMIN D (ERGOCALCIFEROL) 1.25 MG (50000 UNIT) PO CAPS: 50000.0000 [IU] | ORAL_CAPSULE | ORAL | 1 refills | Status: DC

## 2022-04-03 MED ORDER — ROSUVASTATIN CALCIUM 20 MG PO TABS: 20.0000 mg | ORAL_TABLET | Freq: Every day | ORAL | 3 refills | Status: DC

## 2022-04-03 NOTE — Telephone Encounter (Signed)
Updated patient on her labs   Cholesterol panel is elevated - will place back on Crestor   Vitamin D is low - will start on Vit D 50,000 units

## 2022-04-03 NOTE — Progress Notes (Signed)
Subjective:    Patient ID: Pamela Hall, female    DOB: 1986-10-02, 36 y.o.   MRN: [id number]  HPI Patient presents for yearly preventative medicine examination. She is a pleasant 36 year old female who  has a past medical history of Abnormal Pap smear (2008) and High cholesterol.  Dyslipidemia/Possible FH/elevated LPA-She was seen by lipid clinic but was discharged back to primary care.  She stopped her crestor - 4 months ago due to myalgia, she is willing to try crestor again.  Lab Results  Component Value Date   CHOL 142 03/17/2021   HDL 40.50 03/17/2021   LDLCALC 80 03/17/2021   TRIG 105.0 03/17/2021   CHOLHDL 3 03/17/2021   GERD-takes OTC Nexium PRN   Anxiety -managed with Lexapro 10 mg daily.  She uses Ativan as needed.  She does feel well controlled  Concern for sleep apnea - se reports daytime somnolence, fatigue when waking up and snoring. No apneic episodes  Bruising - spontaneous bruising on lower extremities. Bruising does not hurt. No trauma to cause bruising.   All immunizations and health maintenance protocols were reviewed with the patient and needed orders were placed.  Appropriate screening laboratory values were ordered for the patient including screening of hyperlipidemia, renal function and hepatic function.  Medication reconciliation,  past medical history, social history, problem list and allergies were reviewed in detail with the patient  Goals were established with regard to weight loss, exercise, and  diet in compliance with medications Wt Readings from Last 3 Encounters:  04/03/22 261 lb (118.4 kg)  02/01/22 263 lb (119.3 kg)  11/09/21 263 lb 9.6 oz (119.6 kg)   Review of Systems  Constitutional:  Positive for fatigue.  HENT: Negative.    Eyes: Negative.   Respiratory: Negative.    Cardiovascular: Negative.   Gastrointestinal: Negative.   Endocrine: Negative.   Genitourinary: Negative.   Musculoskeletal:  Positive for  arthralgias and myalgias.  Skin: Negative.   Allergic/Immunologic: Negative.   Neurological: Negative.   Hematological:  Bruises/bleeds easily.  Psychiatric/Behavioral:  Positive for sleep disturbance.    Past Medical History:  Diagnosis Date   Abnormal Pap smear 2008   COLPO;WAS NORMAL;LAST PAP 10/2011   High cholesterol     Social History   Socioeconomic History   Marital status: Married    Spouse name: Leeanne Rio   Number of children: 1   Years of education: 17   Highest education level: Not on file  Occupational History   Occupation: NP     Employer: [employer]  Tobacco Use   Smoking status: Never   Smokeless tobacco: Never  Vaping Use   Vaping Use: Never used  Substance and Sexual Activity   Alcohol use: No    Alcohol/week: 1.0 - 2.0 standard drink of alcohol    Types: 1 - 2 Glasses of wine per week    Comment: SOCIAL DRINKER   Drug use: No   Sexual activity: Not Currently    Partners: Male    Birth control/protection: Surgical  Other Topics Concern   Not on file  Social History Narrative   Not on file   Social Determinants of Health   Financial Resource Strain: Not on file  Food Insecurity: Not on file  Transportation Needs: Not on file  Physical Activity: Not on file  Stress: Not on file  Social Connections: Not on file  Intimate Partner Violence: Not on file    Past Surgical History:  Procedure  Laterality Date   TUBAL LIGATION  09/30/2012   Procedure: POST PARTUM TUBAL LIGATION;  Surgeon: Betsy Coder, MD;  Location: [facility];  Service: Gynecology;  Laterality: Bilateral;   WISDOM TOOTH EXTRACTION  2003   ALL 4 EXTRACTED    Family History  Problem Relation Age of Onset   Diabetes type II Maternal Grandmother    Hypertension Maternal Grandmother    Heart disease Maternal Grandmother    Anesthesia problems Maternal Grandmother        ALLERGIC TO GENERAL ANESTHESIA   Other Maternal Grandmother    Breast cancer Maternal Grandmother     Schizophrenia Mother    Bipolar disorder Mother    Heart disease Mother    Heart attack Mother     Allergies  Allergen Reactions   Statins     Current Outpatient Medications on File Prior to Visit  Medication Sig Dispense Refill   escitalopram (LEXAPRO) 10 MG tablet TAKE 1 TABLET BY MOUTH DAILY 90 tablet 1   ibuprofen (ADVIL) 600 MG tablet ibuprofen 600 mg tablet  TAKE 1 TABLET BY MOUTH EVERY 8 HOURS AS NEEDED     LORazepam (ATIVAN) 1 MG tablet Take 1 tablet (1 mg total) by mouth as needed for anxiety (for flying). 30 tablet 0   valACYclovir (VALTREX) 500 MG tablet Take 1 tablet by mouth once daily as needed. 90 tablet 3   No current facility-administered medications on file prior to visit.    BP 120/60   Pulse 60   Temp 98.3 F (36.8 C) (Oral)   Ht 5\' 7"  (1.702 m)   Wt 261 lb (118.4 kg)   SpO2 99%   BMI 40.88 kg/m       Objective:   Physical Exam Vitals and nursing note reviewed.  Constitutional:      General: She is not in acute distress.    Appearance: Normal appearance. She is well-developed. She is obese. She is not ill-appearing.  HENT:     Head: Normocephalic and atraumatic.     Right Ear: Tympanic membrane, ear canal and external ear normal. There is no impacted cerumen.     Left Ear: Tympanic membrane, ear canal and external ear normal. There is no impacted cerumen.     Nose: Nose normal. No congestion or rhinorrhea.     Mouth/Throat:     Mouth: Mucous membranes are moist.     Pharynx: Oropharynx is clear. No oropharyngeal exudate or posterior oropharyngeal erythema.  Eyes:     General:        Right eye: No discharge.        Left eye: No discharge.     Extraocular Movements: Extraocular movements intact.     Conjunctiva/sclera: Conjunctivae normal.     Pupils: Pupils are equal, round, and reactive to light.  Neck:     Thyroid: No thyromegaly.     Vascular: No carotid bruit.     Trachea: No tracheal deviation.  Cardiovascular:     Rate and Rhythm:  Normal rate and regular rhythm.     Pulses: Normal pulses.     Heart sounds: Normal heart sounds. No murmur heard.    No friction rub. No gallop.  Pulmonary:     Effort: Pulmonary effort is normal. No respiratory distress.     Breath sounds: Normal breath sounds. No stridor. No wheezing, rhonchi or rales.  Chest:     Chest wall: No tenderness.  Abdominal:     General: Abdomen is flat. Bowel  sounds are normal. There is no distension.     Palpations: Abdomen is soft. There is no mass.     Tenderness: There is no abdominal tenderness. There is no right CVA tenderness, left CVA tenderness, guarding or rebound.     Hernia: No hernia is present.  Musculoskeletal:        General: No swelling, tenderness, deformity or signs of injury. Normal range of motion.     Cervical back: Normal range of motion and neck supple.     Right lower leg: No edema.     Left lower leg: No edema.  Lymphadenopathy:     Cervical: No cervical adenopathy.  Skin:    General: Skin is warm and dry.     Coloration: Skin is not jaundiced or pale.     Findings: Bruising (bruising throughout lower extremities) present. No erythema, lesion or rash.  Neurological:     General: No focal deficit present.     Mental Status: She is alert and oriented to person, place, and time.     Cranial Nerves: No cranial nerve deficit.     Sensory: No sensory deficit.     Motor: No weakness.     Coordination: Coordination normal.     Gait: Gait normal.     Deep Tendon Reflexes: Reflexes normal.  Psychiatric:        Mood and Affect: Mood normal.        Behavior: Behavior normal.        Thought Content: Thought content normal.        Judgment: Judgment normal.       Assessment & Plan:  1. Encounter for general adult medical examination with abnormal findings - Encouraged diet and exercise - Follow up in one year or sooner if needed - CBC with Differential/Platelet; Future - Comprehensive metabolic panel; Future - Lipid panel;  Future - TSH; Future - Hemoglobin A1c; Future - Hemoglobin A1c - TSH - Lipid panel - Comprehensive metabolic panel - CBC with Differential/Platelet  2. Anxiety - Continue with Celexa   3. Familial hypercholesterolemia - Consider adding back crestor  - CBC with Differential/Platelet; Future - Comprehensive metabolic panel; Future - Lipid panel; Future - TSH; Future - Hemoglobin A1c; Future - Hemoglobin A1c - TSH - Lipid panel - Comprehensive metabolic panel - CBC with Differential/Platelet  4. Need for hepatitis C screening test  - Hep C Antibody; Future - Hep C Antibody  5. Class 3 obesity (HCC) - encouraged weight loss through diet and exercise  - CBC with Differential/Platelet; Future - Comprehensive metabolic panel; Future - Lipid panel; Future - TSH; Future - Hemoglobin A1c; Future - Hemoglobin A1c - TSH - Lipid panel - Comprehensive metabolic panel - CBC with Differential/Platelet  6. Bruising  - IBC + Ferritin; Future - Vitamin B12; Future - VITAMIN D 25 Hydroxy (Vit-D Deficiency, Fractures); Future - VITAMIN D 25 Hydroxy (Vit-D Deficiency, Fractures) - Vitamin B12 - IBC + Ferritin  7. Sleep apnea, unspecified type  - Ambulatory referral to Pulmonology  Shirline Frees, NP

## 2022-04-04 LAB — HEPATITIS C ANTIBODY: Hepatitis C Ab: NONREACTIVE

## 2022-05-04 ENCOUNTER — Ambulatory Visit: Payer: No Typology Code available for payment source | Admitting: Adult Health

## 2022-06-09 ENCOUNTER — Other Ambulatory Visit: Payer: Self-pay | Admitting: Adult Health

## 2022-06-22 ENCOUNTER — Ambulatory Visit (INDEPENDENT_AMBULATORY_CARE_PROVIDER_SITE_OTHER): Payer: No Typology Code available for payment source | Admitting: Primary Care

## 2022-06-22 ENCOUNTER — Encounter: Payer: Self-pay | Admitting: Primary Care

## 2022-06-22 VITALS — BP 122/76 | HR 70 | Temp 98.7°F | Ht 67.0 in | Wt 263.8 lb

## 2022-06-22 DIAGNOSIS — R0683 Snoring: Secondary | ICD-10-CM

## 2022-06-22 NOTE — Assessment & Plan Note (Signed)
-   Patient has symptoms of loud snoring and nonrestorative sleep.  Epworth score is 10.  BMI 41.  Neck circumference greater than 16 inches.  Patient is at risk for sleep apnea due to obesity.  She will need home sleep study to evaluate.  We discussed risks of untreated sleep apnea including cardiac arrhythmias, pulmonary hypertension, stroke and diabetes.  We also briefly reviewed treatment options including weight loss, oral appliance, CPAP therapy or referral to ENT for possible surgical options.  Encourage side sleeping position or elevate head of bed 30 degrees while sleeping.  Advised against driving if experiencing excessive daytime sleepiness or fatigue.  Follow-up 1 to 2 weeks after sleep study to review results and treatment options if needed.

## 2022-06-22 NOTE — Progress Notes (Signed)
'@Patient'  ID: Pamela Hall, female    DOB: 1986-02-28, 36 y.o.   MRN: [id number]  Chief Complaint  Patient presents with   Consult    Referring provider: Dorothyann Peng, NP  HPI: 36 year old female, never smoked.  Past medical history significant for fibrotic breast disease, mixed hyperlipidemia, obesity.  06/22/2022 Presents today for sleep consult.  She has symptoms of snoring and nonrestorative sleep.  Snoring will wake up her husband. Typical bedtime is between 930 and 10 PM.  She falls asleep instantaneously.  She wakes up on average 0-1 times a night.  She starts her day between 6 and 6:30 AM.  Her weight has fluctuated 20 pounds in the last 2 years.  She has never had a prior sleep study.  She does not wear CPAP or oxygen.  Epworth score is 10.  Denies symptoms of narcolepsy, cataplexy or sleepwalking.  Sleep questionnaire Symptoms-snoring, not feeling rested after 8 to 9 hours of sleep Prior sleep study-none Bedtime-930 and 10pm Time to fall asleep-instantly Nocturnal awakenings-0-1 Out of bed/start of day-6 to 6:30 AM Weight changes- +20 lbs Do you operate heavy machinery- drives Do you currently wear CPAP- No Do you current wear oxygen- No Epworth- 10   Allergies  Allergen Reactions   Statins     Pravastatin and Crestor     Immunization History  Administered Date(s) Administered   Influenza Split 08/27/2012   Influenza,inj,Quad PF,6+ Mos 07/30/2017, 07/15/2020   MMR 02/26/2014   Moderna Sars-Covid-2 Vaccination 10/21/2019, 11/18/2019, 08/15/2020, 09/01/2020   Td 03/03/2020   Tdap 11/13/2009    Past Medical History:  Diagnosis Date   Abnormal Pap smear 2008   COLPO;WAS NORMAL;LAST PAP 10/2011   High cholesterol     Tobacco History: Social History   Tobacco Use  Smoking Status Never   Passive exposure: Past  Smokeless Tobacco Never   Counseling given: Not Answered   Outpatient Medications Prior to Visit  Medication Sig  Dispense Refill   escitalopram (LEXAPRO) 10 MG tablet TAKE 1 TABLET BY MOUTH DAILY 90 tablet 1   ibuprofen (ADVIL) 600 MG tablet ibuprofen 600 mg tablet  TAKE 1 TABLET BY MOUTH EVERY 8 HOURS AS NEEDED     LORazepam (ATIVAN) 1 MG tablet Take 1 tablet (1 mg total) by mouth as needed for anxiety (for flying). 30 tablet 0   rosuvastatin (CRESTOR) 20 MG tablet Take 1 tablet (20 mg total) by mouth daily. 90 tablet 3   valACYclovir (VALTREX) 500 MG tablet Take 1 tablet by mouth once daily as needed. 90 tablet 3   Vitamin D, Ergocalciferol, (DRISDOL) 1.25 MG (50000 UNIT) CAPS capsule Take 1 capsule (50,000 Units total) by mouth every 7 (seven) days. 12 capsule 0   No facility-administered medications prior to visit.    Review of Systems  Review of Systems  Constitutional: Negative.   HENT: Negative.    Respiratory: Negative.    Cardiovascular: Negative.     Physical Exam  BP 122/76 (BP Location: Left Arm, Patient Position: Sitting, Cuff Size: Large)   Pulse 70   Temp 98.7 F (37.1 C) (Oral)   Ht '5\' 7"'  (1.702 m)   Wt 263 lb 12.8 oz (119.7 kg)   SpO2 98%   BMI 41.32 kg/m  Physical Exam Constitutional:      General: She is in acute distress.     Appearance: Normal appearance. She is not ill-appearing.  HENT:     Head: Normocephalic and atraumatic.  Mouth/Throat:     Mouth: Mucous membranes are moist.     Pharynx: Oropharynx is clear.     Comments: Mallampati class II Neck:     Comments: Neck circumference 16 inches Cardiovascular:     Rate and Rhythm: Normal rate and regular rhythm.  Pulmonary:     Effort: Pulmonary effort is normal.     Breath sounds: Normal breath sounds.  Musculoskeletal:        General: Normal range of motion.     Cervical back: Normal range of motion and neck supple.  Skin:    General: Skin is warm and dry.  Neurological:     General: No focal deficit present.     Mental Status: She is alert and oriented to person, place, and time. Mental status  is at baseline.  Psychiatric:        Mood and Affect: Mood normal.        Behavior: Behavior normal.        Thought Content: Thought content normal.        Judgment: Judgment normal.      Lab Results:  CBC    Component Value Date/Time   WBC 5.5 04/03/2022 0833   RBC 5.29 (H) 04/03/2022 0833   HGB 12.6 04/03/2022 0833   HCT 39.1 04/03/2022 0833   PLT 237.0 04/03/2022 0833   MCV 73.9 (L) 04/03/2022 0833   MCH 28.3 04/11/2013 2222   MCHC 32.2 04/03/2022 0833   RDW 16.8 (H) 04/03/2022 0833   LYMPHSABS 2.4 04/03/2022 0833   MONOABS 0.6 04/03/2022 0833   EOSABS 0.1 04/03/2022 0833   BASOSABS 0.0 04/03/2022 0833    BMET    Component Value Date/Time   NA 136 04/03/2022 0833   K 4.0 04/03/2022 0833   CL 103 04/03/2022 0833   CO2 27 04/03/2022 0833   GLUCOSE 102 (H) 04/03/2022 0833   BUN 8 04/03/2022 0833   CREATININE 0.73 04/03/2022 0833   CALCIUM 9.0 04/03/2022 0833   GFRNONAA >90 04/11/2013 2222   GFRAA >90 04/11/2013 2222    BNP No results found for: "BNP"  ProBNP No results found for: "PROBNP"  Imaging: No results found.   Assessment & Plan:   Snoring - Patient has symptoms of loud snoring and nonrestorative sleep.  Epworth score is 10.  BMI 41.  Neck circumference greater than 16 inches.  Patient is at risk for sleep apnea due to obesity.  She will need home sleep study to evaluate.  We discussed risks of untreated sleep apnea including cardiac arrhythmias, pulmonary hypertension, stroke and diabetes.  We also briefly reviewed treatment options including weight loss, oral appliance, CPAP therapy or referral to ENT for possible surgical options.  Encourage side sleeping position or elevate head of bed 30 degrees while sleeping.  Advised against driving if experiencing excessive daytime sleepiness or fatigue.  Follow-up 1 to 2 weeks after sleep study to review results and treatment options if needed.   Martyn Ehrich, NP 06/22/2022

## 2022-06-22 NOTE — Patient Instructions (Addendum)
Sleep apnea is defined as period of 10 seconds or longer when you stop breathing at night. This can happen multiple times a night. Dx sleep apnea is when this occurs more than 5 times an hour.    Mild OSA 5-15 apneic events an hour Moderate OSA 15-30 apneic events an hour Severe OSA > 30 apneic events an hour   Untreated sleep apnea puts you at higher risk for cardiac arrhythmias, pulmonary HTN, stroke and diabetes   Treatment options include weight loss, side sleeping position, oral appliance, CPAP therapy or referral to ENT for possible surgical options    Recommendations: Focus on side sleeping position Work on weight loss efforts if indicated  Do not drive if experiencing excessive daytime sleepiness of fatigue    Orders: Home sleep study re: loud snoring (ordered)    Follow-up: Please call to schedule follow-up 1-2 weeks after completing home sleep study to review results and treatment if needed   Sleep Apnea Sleep apnea affects breathing during sleep. It causes breathing to stop for 10 seconds or more, or to become shallow. People with sleep apnea usually snore loudly. It can also increase the risk of: Heart attack. Stroke. Being very overweight (obese). Diabetes. Heart failure. Irregular heartbeat. High blood pressure. The goal of treatment is to help you breathe normally again. What are the causes?  The most common cause of this condition is a collapsed or blocked airway. There are three kinds of sleep apnea: Obstructive sleep apnea. This is caused by a blocked or collapsed airway. Central sleep apnea. This happens when the brain does not send the right signals to the muscles that control breathing. Mixed sleep apnea. This is a combination of obstructive and central sleep apnea. What increases the risk? Being overweight. Smoking. Having a small airway. Being older. Being female. Drinking alcohol. Taking medicines to calm yourself (sedatives or  tranquilizers). Having family members with the condition. Having a tongue or tonsils that are larger than normal. What are the signs or symptoms? Trouble staying asleep. Loud snoring. Headaches in the morning. Waking up gasping. Dry mouth or sore throat in the morning. Being sleepy or tired during the day. If you are sleepy or tired during the day, you may also: Not be able to focus your mind (concentrate). Forget things. Get angry a lot and have mood swings. Feel sad (depressed). Have changes in your personality. Have less interest in sex, if you are female. Be unable to have an erection, if you are female. How is this treated?  Sleeping on your side. Using a medicine to get rid of mucus in your nose (decongestant). Avoiding the use of alcohol, medicines to help you relax, or certain pain medicines (narcotics). Losing weight, if needed. Changing your diet. Quitting smoking. Using a machine to open your airway while you sleep, such as: An oral appliance. This is a mouthpiece that shifts your lower jaw forward. A CPAP device. This device blows air through a mask when you breathe out (exhale). An EPAP device. This has valves that you put in each nostril. A BIPAP device. This device blows air through a mask when you breathe in (inhale) and breathe out. Having surgery if other treatments do not work. Follow these instructions at home: Lifestyle Make changes that your doctor recommends. Eat a healthy diet. Lose weight if needed. Avoid alcohol, medicines to help you relax, and some pain medicines. Do not smoke or use any products that contain nicotine or tobacco. If you need help quitting,  ask your doctor. General instructions Take over-the-counter and prescription medicines only as told by your doctor. If you were given a machine to use while you sleep, use it only as told by your doctor. If you are having surgery, make sure to tell your doctor you have sleep apnea. You may need to  bring your device with you. Keep all follow-up visits. Contact a doctor if: The machine that you were given to use during sleep bothers you or does not seem to be working. You do not get better. You get worse. Get help right away if: Your chest hurts. You have trouble breathing in enough air. You have an uncomfortable feeling in your back, arms, or stomach. You have trouble talking. One side of your body feels weak. A part of your face is hanging down. These symptoms may be an emergency. Get help right away. Call your local emergency services (911 in the [place] Do not wait to see if the symptoms will go away. Do not drive yourself to the hospital. Summary This condition affects breathing during sleep. The most common cause is a collapsed or blocked airway. The goal of treatment is to help you breathe normally while you sleep. This information is not intended to replace advice given to you by your health care provider. Make sure you discuss any questions you have with your health care provider. Document Revised: 05/10/2021 Document Reviewed: 09/09/2020 Elsevier Patient Education  2023 [employer].

## 2022-06-29 ENCOUNTER — Other Ambulatory Visit: Payer: Self-pay | Admitting: Adult Health

## 2022-06-29 DIAGNOSIS — F419 Anxiety disorder, unspecified: Secondary | ICD-10-CM

## 2022-07-05 ENCOUNTER — Ambulatory Visit (INDEPENDENT_AMBULATORY_CARE_PROVIDER_SITE_OTHER): Payer: No Typology Code available for payment source | Admitting: Adult Health

## 2022-07-05 ENCOUNTER — Encounter: Payer: Self-pay | Admitting: Adult Health

## 2022-07-05 VITALS — BP 120/80 | HR 65 | Temp 98.5°F | Ht 67.0 in | Wt 264.0 lb

## 2022-07-05 DIAGNOSIS — E7801 Familial hypercholesterolemia: Secondary | ICD-10-CM | POA: Diagnosis not present

## 2022-07-05 DIAGNOSIS — E559 Vitamin D deficiency, unspecified: Secondary | ICD-10-CM

## 2022-07-05 LAB — LIPID PANEL
Cholesterol: 156 mg/dL (ref 0–200)
HDL: 37.8 mg/dL — ABNORMAL LOW (ref >39.00)
LDL Cholesterol: 98 mg/dL (ref 0–99)
NonHDL: 117.93
Total CHOL/HDL Ratio: 4
Triglycerides: 101 mg/dL (ref 0.0–149.0)
VLDL: 20.2 mg/dL (ref 0.0–40.0)

## 2022-07-05 LAB — VITAMIN D 25 HYDROXY (VIT D DEFICIENCY, FRACTURES): VITD: 20.26 ng/mL — ABNORMAL LOW (ref 30.00–100.00)

## 2022-07-05 NOTE — Progress Notes (Signed)
Subjective:    Patient ID: Pamela Hall, female    DOB: 19-Jul-1986, 36 y.o.   MRN: [id number]  HPI 36 year old female who  has a past medical history of Abnormal Pap smear (2008) and High cholesterol.  She presents to the office today for follow up regarding hyperlipidemia and vitamin D deficiency.   She has restarted crestor 20 mg every other day and is tolerating well.  Lab Results  Component Value Date   CHOL 241 (H) 04/03/2022   HDL 34.20 (L) 04/03/2022   LDLCALC 170 (H) 04/03/2022   TRIG 187.0 (H) 04/03/2022   CHOLHDL 7 04/03/2022   She was also placed on Vitmain D 50,000 units weekly three months ago for a vitamin D level under 20.   She would like to recheck her labs today    Review of Systems See HPI   Past Medical History:  Diagnosis Date   Abnormal Pap smear 2008   COLPO;WAS NORMAL;LAST PAP 10/2011   High cholesterol     Social History   Socioeconomic History   Marital status: Married    Spouse name: Lucia Gaskins   Number of children: 1   Years of education: 17   Highest education level: Not on file  Occupational History   Occupation: NP     Employer: [employer]  Tobacco Use   Smoking status: Never    Passive exposure: Past   Smokeless tobacco: Never  Vaping Use   Vaping Use: Never used  Substance and Sexual Activity   Alcohol use: No    Alcohol/week: 1.0 - 2.0 standard drink of alcohol    Types: 1 - 2 Glasses of wine per week    Comment: SOCIAL DRINKER   Drug use: No   Sexual activity: Not Currently    Partners: Male    Birth control/protection: Surgical  Other Topics Concern   Not on file  Social History Narrative   Not on file   Social Determinants of Health   Financial Resource Strain: Not on file  Food Insecurity: Not on file  Transportation Needs: Not on file  Physical Activity: Not on file  Stress: Not on file  Social Connections: Not on file  Intimate Partner Violence: Not on file    Past Surgical  History:  Procedure Laterality Date   TUBAL LIGATION  09/30/2012   Procedure: POST PARTUM TUBAL LIGATION;  Surgeon: Betsy Coder, MD;  Location: [facility];  Service: Gynecology;  Laterality: Bilateral;   WISDOM TOOTH EXTRACTION  2003   ALL 4 EXTRACTED    Family History  Problem Relation Age of Onset   Diabetes type II Maternal Grandmother    Hypertension Maternal Grandmother    Heart disease Maternal Grandmother    Anesthesia problems Maternal Grandmother        ALLERGIC TO GENERAL ANESTHESIA   Other Maternal Grandmother    Breast cancer Maternal Grandmother    Schizophrenia Mother    Bipolar disorder Mother    Heart disease Mother    Heart attack Mother     Allergies  Allergen Reactions   Statins     Pravastatin and Crestor     Current Outpatient Medications on File Prior to Visit  Medication Sig Dispense Refill   escitalopram (LEXAPRO) 10 MG tablet TAKE 1 TABLET BY MOUTH DAILY 90 tablet 1   ibuprofen (ADVIL) 600 MG tablet ibuprofen 600 mg tablet  TAKE 1 TABLET BY MOUTH EVERY 8 HOURS AS NEEDED  LORazepam (ATIVAN) 1 MG tablet Take 1 tablet (1 mg total) by mouth as needed for anxiety (for flying). 30 tablet 0   rosuvastatin (CRESTOR) 20 MG tablet Take 1 tablet (20 mg total) by mouth daily. 90 tablet 3   valACYclovir (VALTREX) 500 MG tablet Take 1 tablet by mouth once daily as needed. 90 tablet 3   Vitamin D, Ergocalciferol, (DRISDOL) 1.25 MG (50000 UNIT) CAPS capsule Take 1 capsule (50,000 Units total) by mouth every 7 (seven) days. 12 capsule 0   No current facility-administered medications on file prior to visit.    BP 120/80   Pulse 65   Temp 98.5 F (36.9 C) (Oral)   Ht 5\' 7"  (1.702 m)   Wt 264 lb (119.7 kg)   SpO2 99%   BMI 41.35 kg/m       Objective:   Physical Exam Vitals and nursing note reviewed.  Constitutional:      Appearance: Normal appearance.  Musculoskeletal:        General: Normal range of motion.  Skin:    General: Skin is warm and  dry.     Capillary Refill: Capillary refill takes less than 2 seconds.  Neurological:     General: No focal deficit present.     Mental Status: She is alert and oriented to person, place, and time.  Psychiatric:        Mood and Affect: Mood normal.        Behavior: Behavior normal.        Thought Content: Thought content normal.        Judgment: Judgment normal.       Assessment & Plan:  1. Familial hypercholesterolemia  - Lipid panel; Future - Lipid panel  2. Vitamin D deficiency  - VITAMIN D 25 Hydroxy (Vit-D Deficiency, Fractures); Future - VITAMIN D 25 Hydroxy (Vit-D Deficiency, Fractures)  Dorothyann Peng, NP

## 2022-07-06 ENCOUNTER — Ambulatory Visit: Payer: No Typology Code available for payment source | Admitting: Adult Health

## 2022-07-13 ENCOUNTER — Ambulatory Visit: Payer: No Typology Code available for payment source | Admitting: Adult Health

## 2022-08-14 ENCOUNTER — Other Ambulatory Visit: Payer: Self-pay | Admitting: Adult Health

## 2022-09-15 ENCOUNTER — Other Ambulatory Visit: Payer: Self-pay | Admitting: Adult Health

## 2022-09-15 DIAGNOSIS — F419 Anxiety disorder, unspecified: Secondary | ICD-10-CM

## 2022-09-26 ENCOUNTER — Encounter: Payer: Self-pay | Admitting: Primary Care

## 2022-11-29 ENCOUNTER — Ambulatory Visit: Payer: No Typology Code available for payment source | Admitting: Adult Health

## 2022-11-29 VITALS — BP 120/72 | Ht 67.0 in

## 2022-11-29 DIAGNOSIS — M25561 Pain in right knee: Secondary | ICD-10-CM

## 2022-11-29 DIAGNOSIS — M25562 Pain in left knee: Secondary | ICD-10-CM | POA: Diagnosis not present

## 2022-11-29 DIAGNOSIS — J069 Acute upper respiratory infection, unspecified: Secondary | ICD-10-CM | POA: Diagnosis not present

## 2022-11-29 DIAGNOSIS — G8929 Other chronic pain: Secondary | ICD-10-CM | POA: Diagnosis not present

## 2022-11-29 MED ORDER — MELOXICAM 15 MG PO TABS: 15.0000 mg | ORAL_TABLET | Freq: Every day | ORAL | 0 refills | Status: DC | PRN

## 2022-11-29 NOTE — Progress Notes (Signed)
Subjective:    Patient ID: Pamela Hall, female    DOB: Aug 07, 1986, 37 y.o.   MRN: [id number]  HPI 37 year old female who  has a past medical history of Abnormal Pap smear (2008) and High cholesterol.  She presents to the office today for chronic knee pain.  Reports that some days her knee pain will be severe where she has trouble walking and feels instability other days she has no pain at all.  She has known osteoarthritis in her right knee for which we have done a steroid injection which helped tremendously.  She is not currently having pain in either knee today.  She also reports over the last few days developing sinus congestion, postnasal drip, rhinorrhea.  She denies fevers or chills.  Her kids who live at home also with similar symptoms last week into this week.    Review of Systems See HPI   Past Medical History:  Diagnosis Date   Abnormal Pap smear 2008   COLPO;WAS NORMAL;LAST PAP 10/2011   High cholesterol     Social History   Socioeconomic History   Marital status: Married    Spouse name: Lucia Gaskins   Number of children: 1   Years of education: 17   Highest education level: Not on file  Occupational History   Occupation: NP     Employer: [employer]  Tobacco Use   Smoking status: Never    Passive exposure: Past   Smokeless tobacco: Never  Vaping Use   Vaping Use: Never used  Substance and Sexual Activity   Alcohol use: No    Alcohol/week: 1.0 - 2.0 standard drink of alcohol    Types: 1 - 2 Glasses of wine per week    Comment: SOCIAL DRINKER   Drug use: No   Sexual activity: Not Currently    Partners: Male    Birth control/protection: Surgical  Other Topics Concern   Not on file  Social History Narrative   Not on file   Social Determinants of Health   Financial Resource Strain: Not on file  Food Insecurity: Not on file  Transportation Needs: Not on file  Physical Activity: Not on file  Stress: Not on file  Social  Connections: Not on file  Intimate Partner Violence: Not on file    Past Surgical History:  Procedure Laterality Date   TUBAL LIGATION  09/30/2012   Procedure: POST PARTUM TUBAL LIGATION;  Surgeon: Betsy Coder, MD;  Location: [facility];  Service: Gynecology;  Laterality: Bilateral;   WISDOM TOOTH EXTRACTION  2003   ALL 4 EXTRACTED    Family History  Problem Relation Age of Onset   Diabetes type II Maternal Grandmother    Hypertension Maternal Grandmother    Heart disease Maternal Grandmother    Anesthesia problems Maternal Grandmother        ALLERGIC TO GENERAL ANESTHESIA   Other Maternal Grandmother    Breast cancer Maternal Grandmother    Schizophrenia Mother    Bipolar disorder Mother    Heart disease Mother    Heart attack Mother     Allergies  Allergen Reactions   Statins     Pravastatin and Crestor     Current Outpatient Medications on File Prior to Visit  Medication Sig Dispense Refill   escitalopram (LEXAPRO) 10 MG tablet TAKE 1 TABLET BY MOUTH DAILY 90 tablet 1   ibuprofen (ADVIL) 600 MG tablet ibuprofen 600 mg tablet  TAKE 1 TABLET BY MOUTH  EVERY 8 HOURS AS NEEDED     LORazepam (ATIVAN) 1 MG tablet Take 1 tablet (1 mg total) by mouth as needed for anxiety (for flying). 30 tablet 0   rosuvastatin (CRESTOR) 20 MG tablet Take 1 tablet (20 mg total) by mouth daily. 90 tablet 3   valACYclovir (VALTREX) 500 MG tablet Take 1 tablet by mouth once daily as needed. 90 tablet 3   No current facility-administered medications on file prior to visit.    BP 120/72   Ht 5' 7"$  (1.702 m)   BMI 41.35 kg/m       Objective:   Physical Exam Vitals and nursing note reviewed.  Constitutional:      Appearance: Normal appearance. She is obese.  HENT:     Nose: Rhinorrhea present. No congestion. Rhinorrhea is clear.     Right Turbinates: Not enlarged or swollen.     Left Turbinates: Not enlarged or swollen.     Mouth/Throat:     Pharynx: Oropharynx is clear. Uvula  midline. No oropharyngeal exudate or posterior oropharyngeal erythema.  Cardiovascular:     Rate and Rhythm: Normal rate and regular rhythm.     Pulses: Normal pulses.     Heart sounds: Normal heart sounds.  Pulmonary:     Effort: Pulmonary effort is normal.     Breath sounds: Normal breath sounds.  Musculoskeletal:     Right knee: Normal.     Left knee: Normal.  Skin:    General: Skin is warm and dry.  Neurological:     General: No focal deficit present.     Mental Status: She is alert and oriented to person, place, and time.  Psychiatric:        Mood and Affect: Mood normal.        Behavior: Behavior normal.        Thought Content: Thought content normal.        Judgment: Judgment normal.       Assessment & Plan:  1. Chronic pain of both knees - Will xray both  knees today. Last xray of right knee done in 2022. Will holf off on steroid injection today. Advised weight loss and exercise to help with knee pain  - meloxicam (MOBIC) 15 MG tablet; Take 1 tablet (15 mg total) by mouth daily as needed for pain.  Dispense: 90 tablet; Refill: 0 - DG Knee 1-2 Views Left; Future - DG Knee 1-2 Views Right; Future  2. Viral URI - Advised OTC medications - Follow up if not resolved in the next 4-5 days   Dorothyann Peng, NP

## 2022-12-11 ENCOUNTER — Ambulatory Visit (INDEPENDENT_AMBULATORY_CARE_PROVIDER_SITE_OTHER)
Admission: RE | Admit: 2022-12-11 | Discharge: 2022-12-11 | Disposition: A | Discharge status: Home / Self Care | Payer: BC Managed Care – PPO | Source: Ambulatory Visit | Attending: Adult Health | Admitting: Adult Health

## 2022-12-11 ENCOUNTER — Encounter: Payer: Self-pay | Admitting: Radiology

## 2022-12-11 DIAGNOSIS — M25461 Effusion, right knee: Secondary | ICD-10-CM | POA: Diagnosis not present

## 2022-12-11 DIAGNOSIS — M25562 Pain in left knee: Secondary | ICD-10-CM | POA: Diagnosis not present

## 2022-12-11 DIAGNOSIS — M25462 Effusion, left knee: Secondary | ICD-10-CM | POA: Diagnosis not present

## 2022-12-11 DIAGNOSIS — M25561 Pain in right knee: Secondary | ICD-10-CM | POA: Diagnosis not present

## 2022-12-11 DIAGNOSIS — G8929 Other chronic pain: Secondary | ICD-10-CM

## 2022-12-28 ENCOUNTER — Ambulatory Visit: Payer: BC Managed Care – PPO

## 2022-12-28 DIAGNOSIS — R0683 Snoring: Secondary | ICD-10-CM

## 2022-12-28 DIAGNOSIS — G4733 Obstructive sleep apnea (adult) (pediatric): Secondary | ICD-10-CM

## 2023-01-02 DIAGNOSIS — G4733 Obstructive sleep apnea (adult) (pediatric): Secondary | ICD-10-CM | POA: Diagnosis not present

## 2023-01-10 ENCOUNTER — Telehealth (INDEPENDENT_AMBULATORY_CARE_PROVIDER_SITE_OTHER): Payer: BC Managed Care – PPO | Admitting: Primary Care

## 2023-01-10 DIAGNOSIS — G4730 Sleep apnea, unspecified: Secondary | ICD-10-CM

## 2023-01-10 NOTE — Patient Instructions (Addendum)
Home sleep study showed evidence of mild obstructive sleep apnea, you had on average 8.3 apneic events an hour with mild oxygen desaturations. You spent 5 minutes with an oxygen level less than 90%  Treatment options include weight loss, oral appliance, CPAP therapy or referral to ENT for possible surgical options  Referral Orthodontics re: OSA Dr. Augustina Mood, DDS - [phone] Address: [address], [place], [place] [address]  Follow-up 6 months with [facility] or sooner if needed

## 2023-01-10 NOTE — Progress Notes (Signed)
Virtual Visit via Video Note  I connected with [place] on 01/10/23 at 10:30 AM EDT by a video enabled telemedicine application and verified that I am speaking with the correct person using two identifiers.  Location: Patient: Home Provider: Office    I discussed the limitations of evaluation and management by telemedicine and the availability of in person appointments. The patient expressed understanding and agreed to proceed.  History of Present Illness: 37 year old female, never smoked.  Past medical history significant for fibrotic breast disease, mixed hyperlipidemia, obesity.  Previous [facility] pulmonary encounter:  06/22/2022 Presents today for sleep consult.  She has symptoms of snoring and nonrestorative sleep.  Snoring will wake up her husband. Typical bedtime is between 930 and 10 PM.  She falls asleep instantaneously.  She wakes up on average 0-1 times a night.  She starts her day between 6 and 6:30 AM.  Her weight has fluctuated 20 pounds in the last 2 years.  She has never had a prior sleep study.  She does not wear CPAP or oxygen.  Epworth score is 10.  Denies symptoms of narcolepsy, cataplexy or sleepwalking.  Sleep questionnaire Symptoms-snoring, not feeling rested after 8 to 9 hours of sleep Prior sleep study-none Bedtime-930 and 10pm Time to fall asleep-instantly Nocturnal awakenings-0-1 Out of bed/start of day-6 to 6:30 AM Weight changes- +20 lbs Do you operate heavy machinery- drives Do you currently wear CPAP- No Do you current wear oxygen- No Epworth- 10   01/10/2023- Interim hx  Patient contacted today to review sleep study results. She was seen for sleep consult due to snoring symptoms and nonrestorative sleep. She for the most part sleeps well at night except for snoring symptoms which disrupts her partner.  She feels energized during the first half of the day, experiences some fatigue mid-to-late afternoon.  She had home sleep study on  12/28/22 that showed evidence of mild OSA, AHI 8.3/hour with SpO2 low 80%. She spent total of 5 mins with SpO2 <90%. We discussed treatment options for mild obstructive sleep apnea, she would like to work on weight loss efforts and look into getting an oral appliance to help with snoring symptoms.   Observations/Objective:  Appears well; No overt respiratory symptoms  Assessment and Plan:  Mild sleep apnea: - Patient has symptoms of snoring and non-restorative sleep - HST 12/28/22 >> mild OSA, AHI 8.3/hour  - Reviewed treatment options, patient would like to work on weight loss and be referral to orthodontics for oral device  - Encourage side sleeping position, avoid alcohol/sedative prior to bedtime and avoid driving if tired  Follow Up Instructions:   - 6 months with Eustaquio Maize NP or sooner   I discussed the assessment and treatment plan with the patient. The patient was provided an opportunity to ask questions and all were answered. The patient agreed with the plan and demonstrated an understanding of the instructions.   The patient was advised to call back or seek an in-person evaluation if the symptoms worsen or if the condition fails to improve as anticipated.  I provided 20 minutes of non-face-to-face time during this encounter.   Martyn Ehrich, NP

## 2023-02-04 ENCOUNTER — Encounter: Payer: Self-pay | Admitting: Adult Health

## 2023-02-04 DIAGNOSIS — F419 Anxiety disorder, unspecified: Secondary | ICD-10-CM

## 2023-02-05 ENCOUNTER — Encounter: Payer: Self-pay | Admitting: Adult Health

## 2023-02-05 MED ORDER — ESCITALOPRAM OXALATE 10 MG PO TABS: 10.0000 mg | ORAL_TABLET | Freq: Every day | ORAL | 0 refills | Status: DC

## 2023-05-04 ENCOUNTER — Other Ambulatory Visit: Payer: Self-pay | Admitting: Adult Health

## 2023-05-04 DIAGNOSIS — F419 Anxiety disorder, unspecified: Secondary | ICD-10-CM

## 2023-05-09 ENCOUNTER — Other Ambulatory Visit (INDEPENDENT_AMBULATORY_CARE_PROVIDER_SITE_OTHER): Payer: BC Managed Care – PPO

## 2023-05-09 ENCOUNTER — Encounter: Payer: Self-pay | Admitting: Adult Health

## 2023-05-09 ENCOUNTER — Ambulatory Visit (INDEPENDENT_AMBULATORY_CARE_PROVIDER_SITE_OTHER): Payer: BC Managed Care – PPO | Admitting: Adult Health

## 2023-05-09 VITALS — BP 110/80 | HR 70 | Temp 98.2°F | Ht 67.0 in | Wt 266.0 lb

## 2023-05-09 DIAGNOSIS — R7309 Other abnormal glucose: Secondary | ICD-10-CM | POA: Diagnosis not present

## 2023-05-09 DIAGNOSIS — Z0000 Encounter for general adult medical examination without abnormal findings: Secondary | ICD-10-CM

## 2023-05-09 DIAGNOSIS — M25562 Pain in left knee: Secondary | ICD-10-CM

## 2023-05-09 DIAGNOSIS — Z0001 Encounter for general adult medical examination with abnormal findings: Secondary | ICD-10-CM

## 2023-05-09 DIAGNOSIS — E7801 Familial hypercholesterolemia: Secondary | ICD-10-CM

## 2023-05-09 DIAGNOSIS — M25561 Pain in right knee: Secondary | ICD-10-CM

## 2023-05-09 DIAGNOSIS — F419 Anxiety disorder, unspecified: Secondary | ICD-10-CM | POA: Diagnosis not present

## 2023-05-09 DIAGNOSIS — M7711 Lateral epicondylitis, right elbow: Secondary | ICD-10-CM

## 2023-05-09 DIAGNOSIS — G8929 Other chronic pain: Secondary | ICD-10-CM

## 2023-05-09 LAB — CBC WITH DIFFERENTIAL/PLATELET
Basophils Absolute: 0 10*3/uL (ref 0.0–0.1)
Basophils Relative: 0.5 % (ref 0.0–3.0)
Eosinophils Absolute: 0.1 10*3/uL (ref 0.0–0.7)
Eosinophils Relative: 1.6 % (ref 0.0–5.0)
HCT: 42.6 % (ref 36.0–46.0)
Hemoglobin: 13.8 g/dL (ref 12.0–15.0)
Lymphocytes Relative: 36.5 % (ref 12.0–46.0)
Lymphs Abs: 3.1 10*3/uL (ref 0.7–4.0)
MCHC: 32.3 g/dL (ref 30.0–36.0)
MCV: 81.5 fl (ref 78.0–100.0)
Monocytes Absolute: 0.8 10*3/uL (ref 0.1–1.0)
Monocytes Relative: 9.8 % (ref 3.0–12.0)
Neutro Abs: 4.4 10*3/uL (ref 1.4–7.7)
Neutrophils Relative %: 51.6 % (ref 43.0–77.0)
Platelets: 248 10*3/uL (ref 150.0–400.0)
RBC: 5.22 Mil/uL — ABNORMAL HIGH (ref 3.87–5.11)
RDW: 14.5 % (ref 11.5–15.5)
WBC: 8.6 10*3/uL (ref 4.0–10.5)

## 2023-05-09 LAB — COMPREHENSIVE METABOLIC PANEL
ALT: 10 U/L (ref 0–35)
AST: 13 U/L (ref 0–37)
Albumin: 4 g/dL (ref 3.5–5.2)
Alkaline Phosphatase: 66 U/L (ref 39–117)
BUN: 11 mg/dL (ref 6–23)
CO2: 27 mEq/L (ref 19–32)
Calcium: 9.4 mg/dL (ref 8.4–10.5)
Chloride: 103 mEq/L (ref 96–112)
Creatinine, Ser: 0.76 mg/dL (ref 0.40–1.20)
GFR: 99.25 mL/min (ref >60.00)
Glucose, Bld: 116 mg/dL — ABNORMAL HIGH (ref 70–99)
Potassium: 4.2 mEq/L (ref 3.5–5.1)
Sodium: 136 mEq/L (ref 135–145)
Total Bilirubin: 0.3 mg/dL (ref 0.2–1.2)
Total Protein: 7.5 g/dL (ref 6.0–8.3)

## 2023-05-09 LAB — TSH: TSH: 1.36 u[IU]/mL (ref 0.35–5.50)

## 2023-05-09 LAB — LIPID PANEL
Cholesterol: 253 mg/dL — ABNORMAL HIGH (ref 0–200)
HDL: 37.3 mg/dL — ABNORMAL LOW (ref >39.00)
NonHDL: 215.48
Total CHOL/HDL Ratio: 7
Triglycerides: 217 mg/dL — ABNORMAL HIGH (ref 0.0–149.0)
VLDL: 43.4 mg/dL — ABNORMAL HIGH (ref 0.0–40.0)

## 2023-05-09 LAB — LDL CHOLESTEROL, DIRECT: Direct LDL: 172 mg/dL

## 2023-05-09 LAB — HEMOGLOBIN A1C: Hgb A1c MFr Bld: 5.6 % (ref 4.6–6.5)

## 2023-05-09 NOTE — Telephone Encounter (Signed)
Ok to add order? Please advise

## 2023-05-09 NOTE — Progress Notes (Signed)
Subjective:    Patient ID: Pamela Hall, female    DOB: [birth year], 37 y.o.   MRN: [id number]  HPI Patient presents for yearly preventative medicine examination. She is a pleasant 37 year old female who  has a past medical history of Abnormal Pap smear (2008), Anxiety and depression, and High cholesterol.  Dyslipidemia/Possible FH/elevated LPA-She was seen by lipid clinic but was discharged back to primary care. She is taking Crestor 20 mg every other day. She denies myalgia or fatigue  Lab Results  Component Value Date   CHOL 156 07/05/2022   HDL 37.80 (L) 07/05/2022   LDLCALC 98 07/05/2022   TRIG 101.0 07/05/2022   CHOLHDL 4 07/05/2022   GERD-takes OTC Nexium PRN   Anxiety -managed with Lexapro 10 mg daily.  She uses Ativan as needed.  She does feel well controlled  Chronic bilateral knee pain from osteoarthritis - uses Mobic PRN. This helps to some degree.   Class 3 obesity - she tries to eat healthy and stay active. Has a hard time losing weight. She is interested in GLP therapy.   Wt Readings from Last 3 Encounters:  05/09/23 266 lb (120.7 kg)  07/05/22 264 lb (119.7 kg)  06/22/22 263 lb 12.8 oz (119.7 kg)   Acute Issue  - Right elbow pain - has been present for about 2 months. She reports waking up one morning with pain in her right elbow. Pain is worse with ROM and lifting objects. She has tried ice/heat/Mobic/Tylenol/Voltaren/Motrin which are " hit or miss".  No swelling or redness   All immunizations and health maintenance protocols were reviewed with the patient and needed orders were placed. She is up to date on routine vaccinations   Appropriate screening laboratory values were ordered for the patient including screening of hyperlipidemia, renal function and hepatic function.   Medication reconciliation,  past medical history, social history, problem list and allergies were reviewed in detail with the patient  Goals were established with  regard to weight loss, exercise, and  diet in compliance with medications  She is up to date on routine GYN care  Review of Systems  Constitutional: Negative.   HENT: Negative.    Eyes: Negative.   Respiratory: Negative.    Cardiovascular: Negative.   Gastrointestinal: Negative.   Endocrine: Negative.   Genitourinary: Negative.   Musculoskeletal:  Positive for arthralgias.  Skin: Negative.   Allergic/Immunologic: Negative.   Neurological: Negative.   Hematological: Negative.   Psychiatric/Behavioral: Negative.     Past Medical History:  Diagnosis Date   Abnormal Pap smear 2008   COLPO;WAS NORMAL;LAST PAP 10/2011   Anxiety and depression    High cholesterol     Social History   Socioeconomic History   Marital status: Married    Spouse name: Leeanne Rio   Number of children: 1   Years of education: 17   Highest education level: Not on file  Occupational History   Occupation: NP     Employer: [employer]  Tobacco Use   Smoking status: Never    Passive exposure: Past   Smokeless tobacco: Never  Vaping Use   Vaping status: Never Used  Substance and Sexual Activity   Alcohol use: No    Alcohol/week: 1.0 - 2.0 standard drink of alcohol    Types: 1 - 2 Glasses of wine per week    Comment: SOCIAL DRINKER   Drug use: No   Sexual activity: Not Currently    Partners: Male  Birth control/protection: Surgical  Other Topics Concern   Not on file  Social History Narrative   Not on file   Social Determinants of Health   Financial Resource Strain: Not on file  Food Insecurity: Not on file  Transportation Needs: Not on file  Physical Activity: Not on file  Stress: Not on file  Social Connections: Not on file  Intimate Partner Violence: Not on file    Past Surgical History:  Procedure Laterality Date   TUBAL LIGATION  09/30/2012   Procedure: POST PARTUM TUBAL LIGATION;  Surgeon: Michael Litter, MD;  Location: [facility];  Service: Gynecology;  Laterality:  Bilateral;   WISDOM TOOTH EXTRACTION  2003   ALL 4 EXTRACTED    Family History  Problem Relation Age of Onset   Diabetes type II Maternal Grandmother    Hypertension Maternal Grandmother    Heart disease Maternal Grandmother    Anesthesia problems Maternal Grandmother        ALLERGIC TO GENERAL ANESTHESIA   Other Maternal Grandmother    Breast cancer Maternal Grandmother    Schizophrenia Mother    Bipolar disorder Mother    Heart disease Mother    Heart attack Mother     Allergies  Allergen Reactions   Statins     Pravastatin and Crestor     Current Outpatient Medications on File Prior to Visit  Medication Sig Dispense Refill   escitalopram (LEXAPRO) 10 MG tablet TAKE 1 TABLET BY MOUTH EVERY DAY 90 tablet 0   ibuprofen (ADVIL) 600 MG tablet ibuprofen 600 mg tablet  TAKE 1 TABLET BY MOUTH EVERY 8 HOURS AS NEEDED     LORazepam (ATIVAN) 1 MG tablet Take 1 tablet (1 mg total) by mouth as needed for anxiety (for flying). 30 tablet 0   meloxicam (MOBIC) 15 MG tablet Take 1 tablet (15 mg total) by mouth daily as needed for pain. 90 tablet 0   rosuvastatin (CRESTOR) 20 MG tablet Take 1 tablet (20 mg total) by mouth daily. 90 tablet 3   valACYclovir (VALTREX) 500 MG tablet Take 1 tablet by mouth once daily as needed. 90 tablet 3   No current facility-administered medications on file prior to visit.    BP 110/80   Pulse 70   Temp 98.2 F (36.8 C) (Oral)   Ht 5\' 7"  (1.702 m)   Wt 266 lb (120.7 kg)   SpO2 98%   BMI 41.66 kg/m       Objective:   Physical Exam Vitals and nursing note reviewed.  Constitutional:      General: She is not in acute distress.    Appearance: Normal appearance. She is obese. She is not ill-appearing.  HENT:     Head: Normocephalic and atraumatic.     Right Ear: Tympanic membrane, ear canal and external ear normal. There is no impacted cerumen.     Left Ear: Tympanic membrane, ear canal and external ear normal. There is no impacted cerumen.      Nose: Nose normal. No congestion or rhinorrhea.     Mouth/Throat:     Mouth: Mucous membranes are moist.     Pharynx: Oropharynx is clear.  Eyes:     Extraocular Movements: Extraocular movements intact.     Conjunctiva/sclera: Conjunctivae normal.     Pupils: Pupils are equal, round, and reactive to light.  Neck:     Vascular: No carotid bruit.  Cardiovascular:     Rate and Rhythm: Normal rate and regular rhythm.  Pulses: Normal pulses.     Heart sounds: No murmur heard.    No friction rub. No gallop.  Pulmonary:     Effort: Pulmonary effort is normal.     Breath sounds: Normal breath sounds.  Abdominal:     General: Abdomen is flat. Bowel sounds are normal. There is no distension.     Palpations: Abdomen is soft. There is no mass.     Tenderness: There is no abdominal tenderness. There is no guarding or rebound.     Hernia: No hernia is present.  Musculoskeletal:        General: Normal range of motion.     Right elbow: Tenderness present in lateral epicondyle.     Cervical back: Normal range of motion and neck supple.     Comments: + Cozen's test   Lymphadenopathy:     Cervical: No cervical adenopathy.  Skin:    General: Skin is warm and dry.     Capillary Refill: Capillary refill takes less than 2 seconds.  Neurological:     General: No focal deficit present.     Mental Status: She is alert and oriented to person, place, and time.  Psychiatric:        Mood and Affect: Mood normal.        Behavior: Behavior normal.        Thought Content: Thought content normal.        Judgment: Judgment normal.       Assessment & Plan:  1. Routine general medical examination at a health care facility Today patient counseled on age appropriate routine health concerns for screening and prevention, each reviewed and up to date or declined. Immunizations reviewed and up to date or declined. Labs ordered and reviewed. Risk factors for depression reviewed and negative. Hearing function  and visual acuity are intact. ADLs screened and addressed as needed. Functional ability and level of safety reviewed and appropriate. Education, counseling and referrals performed based on assessed risks today. Patient provided with a copy of personalized plan for preventive services. - Follow up in one year or sooner if needed   2. Anxiety - Continue Lexapro and Ativan PRN  - CBC with Differential/Platelet; Future - Comprehensive metabolic panel; Future - Lipid panel; Future - TSH; Future - TSH - Lipid panel - Comprehensive metabolic panel - CBC with Differential/Platelet  3. Chronic pain of both knees - Continue Mobic   4. Familial hypercholesterolemia - Consider increase dose of Crestor  - CBC with Differential/Platelet; Future - Comprehensive metabolic panel; Future - Lipid panel; Future - TSH; Future - TSH - Lipid panel - Comprehensive metabolic panel - CBC with Differential/Platelet  5. Class 3 obesity (HCC) - She can check with her insurance to see if Wegovy or Greggory Keen is covered  - CBC with Differential/Platelet; Future - Comprehensive metabolic panel; Future - Lipid panel; Future - TSH; Future - TSH - Lipid panel - Comprehensive metabolic panel - CBC with Differential/Platelet  6. Lateral epicondylitis of right elbow - Discussed PT and or sports medicine referral. She did not want to go through PT at this time. Does not want referral to Sports Medicine.  - Discussed using TENS unit - she is going to try this and let us know before going to see sports medicine   Shirline Frees, NP

## 2023-05-09 NOTE — Telephone Encounter (Signed)
Noted  

## 2023-05-10 DIAGNOSIS — M25521 Pain in right elbow: Secondary | ICD-10-CM | POA: Diagnosis not present

## 2023-05-22 ENCOUNTER — Encounter: Payer: BC Managed Care – PPO | Admitting: Adult Health

## 2023-05-24 ENCOUNTER — Encounter: Payer: Self-pay | Admitting: Adult Health

## 2023-06-04 ENCOUNTER — Other Ambulatory Visit: Payer: Self-pay | Admitting: Adult Health

## 2023-06-04 MED ORDER — EZETIMIBE 10 MG PO TABS: 10.0000 mg | ORAL_TABLET | Freq: Every day | ORAL | 3 refills | Status: DC

## 2023-07-26 DIAGNOSIS — F4320 Adjustment disorder, unspecified: Secondary | ICD-10-CM | POA: Diagnosis not present

## 2023-09-25 ENCOUNTER — Encounter: Payer: Self-pay | Admitting: Adult Health

## 2023-09-25 ENCOUNTER — Telehealth (INDEPENDENT_AMBULATORY_CARE_PROVIDER_SITE_OTHER): Payer: BC Managed Care – PPO | Admitting: Adult Health

## 2023-09-25 DIAGNOSIS — E7801 Familial hypercholesterolemia: Secondary | ICD-10-CM | POA: Diagnosis not present

## 2023-09-25 DIAGNOSIS — F419 Anxiety disorder, unspecified: Secondary | ICD-10-CM

## 2023-09-25 DIAGNOSIS — F32A Depression, unspecified: Secondary | ICD-10-CM | POA: Diagnosis not present

## 2023-09-25 DIAGNOSIS — L821 Other seborrheic keratosis: Secondary | ICD-10-CM

## 2023-09-25 MED ORDER — LORAZEPAM 1 MG PO TABS: 1.0000 mg | ORAL_TABLET | ORAL | 0 refills | Status: DC | PRN

## 2023-09-25 MED ORDER — ROSUVASTATIN CALCIUM 10 MG PO TABS: 10.0000 mg | ORAL_TABLET | Freq: Every day | ORAL | 3 refills | Status: AC

## 2023-09-25 MED ORDER — FLUOXETINE HCL 20 MG PO TABS: 20.0000 mg | ORAL_TABLET | Freq: Every day | ORAL | 3 refills | Status: DC

## 2023-09-25 NOTE — Progress Notes (Signed)
Virtual Visit via Video Note  I connected with Pamela Hall  on 09/25/23 at  1:00 PM EST by a video enabled telemedicine application and verified that I am speaking with the correct person using two identifiers.  Location patient: home Location provider:work or home office Persons participating in the virtual visit: patient, provider  I discussed the limitations of evaluation and management by telemedicine and the availability of in person appointments. The patient expressed understanding and agreed to proceed.   HPI: 37 year old female who is being evaluated today virtually for multiple issues.  She has a history of anxiety and depression and has been on Lexapro 10 mg for quite some time.  She does feel as though this controls her symptoms but she has been experiencing some breakthrough anxiety and she tried increasing her Lexapro dose to 20 mg but this made her feel fatigued.  She would like to switch to Prozac to see if this would be a better fit for her and also because it is more weight neutral.  She also needs a refill of Ativan that she uses when she flies, she is taking her family to [place] for Christmas.  He also has a "mole" on her right lower abdomen.  She feels as though this is a new mole and has been growing in size.  The mole get waxy and dry at times.  She has not had any drainage or pain.   Also she would like to go back on Crestor since she is tolerating Zetia okay to help manage her cholesterol levels.   ROS: See pertinent positives and negatives per HPI.  Past Medical History:  Diagnosis Date   Abnormal Pap smear 2008   COLPO;WAS NORMAL;LAST PAP 10/2011   Anxiety and depression    High cholesterol     Past Surgical History:  Procedure Laterality Date   TUBAL LIGATION  09/30/2012   Procedure: POST PARTUM TUBAL LIGATION;  Surgeon: Michael Litter, MD;  Location: [facility];  Service: Gynecology;  Laterality: Bilateral;   WISDOM TOOTH EXTRACTION  2003   ALL 4  EXTRACTED    Family History  Problem Relation Age of Onset   Diabetes type II Maternal Grandmother    Hypertension Maternal Grandmother    Heart disease Maternal Grandmother    Anesthesia problems Maternal Grandmother        ALLERGIC TO GENERAL ANESTHESIA   Other Maternal Grandmother    Breast cancer Maternal Grandmother    Schizophrenia Mother    Bipolar disorder Mother    Heart disease Mother    Heart attack Mother        Current Outpatient Medications:    escitalopram (LEXAPRO) 10 MG tablet, TAKE 1 TABLET BY MOUTH EVERY DAY, Disp: 90 tablet, Rfl: 0   ezetimibe (ZETIA) 10 MG tablet, Take 1 tablet (10 mg total) by mouth daily., Disp: 90 tablet, Rfl: 3   ibuprofen (ADVIL) 600 MG tablet, ibuprofen 600 mg tablet  TAKE 1 TABLET BY MOUTH EVERY 8 HOURS AS NEEDED, Disp: , Rfl:    LORazepam (ATIVAN) 1 MG tablet, Take 1 tablet (1 mg total) by mouth as needed for anxiety (for flying)., Disp: 30 tablet, Rfl: 0   meloxicam (MOBIC) 15 MG tablet, Take 1 tablet (15 mg total) by mouth daily as needed for pain., Disp: 90 tablet, Rfl: 0   rosuvastatin (CRESTOR) 20 MG tablet, Take 1 tablet (20 mg total) by mouth daily., Disp: 90 tablet, Rfl: 3   valACYclovir (VALTREX) 500 MG  tablet, Take 1 tablet by mouth once daily as needed., Disp: 90 tablet, Rfl: 3  EXAM:  VITALS per patient if applicable:  GENERAL: alert, oriented, appears well and in no acute distress  HEENT: atraumatic, conjunttiva clear, no obvious abnormalities on inspection of external nose and ears  NECK: normal movements of the head and neck  LUNGS: on inspection no signs of respiratory distress, breathing rate appears normal, no obvious gross SOB, gasping or wheezing  CV: no obvious cyanosis  MS: moves all visible extremities without noticeable abnormality  PSYCH/NEURO: pleasant and cooperative, no obvious depression or anxiety, speech and thought processing grossly intact  Skin: dark seborrheic keratosis   ASSESSMENT AND  PLAN:  Discussed the following assessment and plan: 1. Anxiety and depression - Ok to switch to Prozac 20 mg daily  - She will follow up if symptoms not well controlled.  - FLUoxetine (PROZAC) 20 MG tablet; Take 1 tablet (20 mg total) by mouth daily.  Dispense: 90 tablet; Refill: 3 - LORazepam (ATIVAN) 1 MG tablet; Take 1 tablet (1 mg total) by mouth as needed for anxiety (for flying).  Dispense: 30 tablet; Refill: 0   2. Familial hypercholesterolemia  - rosuvastatin (CRESTOR) 10 MG tablet; Take 1 tablet (10 mg total) by mouth daily.  Dispense: 90 tablet; Refill: 3  3. Seborrheic keratosis - Reassurance given    Shirline Frees, NP    I discussed the assessment and treatment plan with the patient. The patient was provided an opportunity to ask questions and all were answered. The patient agreed with the plan and demonstrated an understanding of the instructions.   The patient was advised to call back or seek an in-person evaluation if the symptoms worsen or if the condition fails to improve as anticipated.   Shirline Frees, NP

## 2023-11-07 ENCOUNTER — Other Ambulatory Visit: Payer: Self-pay | Admitting: Adult Health

## 2023-11-07 DIAGNOSIS — Z1231 Encounter for screening mammogram for malignant neoplasm of breast: Secondary | ICD-10-CM

## 2023-11-18 ENCOUNTER — Ambulatory Visit
Admission: RE | Admit: 2023-11-18 | Discharge: 2023-11-18 | Disposition: A | Discharge status: Home / Self Care | Payer: BC Managed Care – PPO | Source: Ambulatory Visit

## 2023-11-18 DIAGNOSIS — Z1231 Encounter for screening mammogram for malignant neoplasm of breast: Secondary | ICD-10-CM | POA: Diagnosis not present

## 2023-11-21 ENCOUNTER — Encounter: Payer: Self-pay | Admitting: Adult Health

## 2023-11-21 DIAGNOSIS — G8929 Other chronic pain: Secondary | ICD-10-CM

## 2023-11-21 DIAGNOSIS — F419 Anxiety disorder, unspecified: Secondary | ICD-10-CM

## 2023-11-22 ENCOUNTER — Other Ambulatory Visit: Payer: Self-pay | Admitting: Adult Health

## 2023-11-22 DIAGNOSIS — F419 Anxiety disorder, unspecified: Secondary | ICD-10-CM

## 2023-11-22 MED ORDER — FLUOXETINE HCL 20 MG PO TABS: 20.0000 mg | ORAL_TABLET | Freq: Every day | ORAL | 3 refills | Status: DC

## 2023-11-22 MED ORDER — LORAZEPAM 1 MG PO TABS: 1.0000 mg | ORAL_TABLET | ORAL | 0 refills | Status: DC | PRN

## 2023-11-22 MED ORDER — MELOXICAM 15 MG PO TABS: 15.0000 mg | ORAL_TABLET | Freq: Every day | ORAL | 0 refills | Status: DC | PRN

## 2023-11-22 NOTE — Telephone Encounter (Signed)
 Okay for refill?

## 2023-12-06 ENCOUNTER — Encounter: Payer: Self-pay | Admitting: Adult Health

## 2023-12-06 ENCOUNTER — Ambulatory Visit (INDEPENDENT_AMBULATORY_CARE_PROVIDER_SITE_OTHER): Payer: BC Managed Care – PPO | Admitting: Adult Health

## 2023-12-06 ENCOUNTER — Other Ambulatory Visit: Payer: Self-pay | Admitting: Adult Health

## 2023-12-06 VITALS — BP 120/80 | HR 62 | Temp 98.1°F | Ht 67.0 in | Wt 262.0 lb

## 2023-12-06 DIAGNOSIS — E7801 Familial hypercholesterolemia: Secondary | ICD-10-CM | POA: Diagnosis not present

## 2023-12-06 DIAGNOSIS — L821 Other seborrheic keratosis: Secondary | ICD-10-CM

## 2023-12-06 LAB — HEPATIC FUNCTION PANEL
ALT: 10 U/L (ref 0–35)
AST: 12 U/L (ref 0–37)
Albumin: 3.9 g/dL (ref 3.5–5.2)
Alkaline Phosphatase: 65 U/L (ref 39–117)
Bilirubin, Direct: 0 mg/dL (ref 0.0–0.3)
Total Bilirubin: 0.4 mg/dL (ref 0.2–1.2)
Total Protein: 7.4 g/dL (ref 6.0–8.3)

## 2023-12-06 LAB — LIPID PANEL
Cholesterol: 228 mg/dL — ABNORMAL HIGH (ref 0–200)
HDL: 36 mg/dL — ABNORMAL LOW (ref >39.00)
LDL Cholesterol: 157 mg/dL — ABNORMAL HIGH (ref 0–99)
NonHDL: 191.68
Total CHOL/HDL Ratio: 6
Triglycerides: 175 mg/dL — ABNORMAL HIGH (ref 0.0–149.0)
VLDL: 35 mg/dL (ref 0.0–40.0)

## 2023-12-06 MED ORDER — EZETIMIBE 10 MG PO TABS: 10.0000 mg | ORAL_TABLET | Freq: Every day | ORAL | 3 refills | Status: AC

## 2023-12-06 NOTE — Progress Notes (Signed)
Subjective:    Patient ID: Pamela Hall, female    DOB: February 01, 1986, 38 y.o.   MRN: [id number]  HPI  38 year old female who  has a past medical history of Abnormal Pap smear (2008), Anxiety and depression, and High cholesterol.  She presents to the office today for follow up regarding dyslipidemia. She likely has familial hypercholesterolemia and was seen in the past at the lipid clinic but discharged back to primary care. She is currently maintained on Crestor 10 mg  daily and Zetia 10 mg. She reports that she has not taken Crestor for about three months. When she was taking this medication she would have periodic muscle aches.   She also reports that she has a skin neoplasm on her right lower abdomen that causes irritation with wearing pants/scrubs and when using belts.   Review of Systems See HPI   Past Medical History:  Diagnosis Date   Abnormal Pap smear 2008   COLPO;WAS NORMAL;LAST PAP 10/2011   Anxiety and depression    High cholesterol     Social History   Socioeconomic History   Marital status: Married    Spouse name: Leeanne Rio   Number of children: 1   Years of education: 17   Highest education level: Not on file  Occupational History   Occupation: NP     Employer: [employer]  Tobacco Use   Smoking status: Never    Passive exposure: Past   Smokeless tobacco: Never  Vaping Use   Vaping status: Never Used  Substance and Sexual Activity   Alcohol use: No    Alcohol/week: 1.0 - 2.0 standard drink of alcohol    Types: 1 - 2 Glasses of wine per week    Comment: SOCIAL DRINKER   Drug use: No   Sexual activity: Not Currently    Partners: Male    Birth control/protection: Surgical  Other Topics Concern   Not on file  Social History Narrative   Not on file   Social Drivers of Health   Financial Resource Strain: Not on file  Food Insecurity: Not on file  Transportation Needs: Not on file  Physical Activity: Not on file  Stress:  Not on file  Social Connections: Not on file  Intimate Partner Violence: Not on file    Past Surgical History:  Procedure Laterality Date   TUBAL LIGATION  09/30/2012   Procedure: POST PARTUM TUBAL LIGATION;  Surgeon: Michael Litter, MD;  Location: [facility];  Service: Gynecology;  Laterality: Bilateral;   WISDOM TOOTH EXTRACTION  2003   ALL 4 EXTRACTED    Family History  Problem Relation Age of Onset   Diabetes type II Maternal Grandmother    Hypertension Maternal Grandmother    Heart disease Maternal Grandmother    Anesthesia problems Maternal Grandmother        ALLERGIC TO GENERAL ANESTHESIA   Other Maternal Grandmother    Breast cancer Maternal Grandmother    Schizophrenia Mother    Bipolar disorder Mother    Heart disease Mother    Heart attack Mother     Allergies  Allergen Reactions   Statins     Pravastatin and Crestor     Current Outpatient Medications on File Prior to Visit  Medication Sig Dispense Refill   ezetimibe (ZETIA) 10 MG tablet Take 1 tablet (10 mg total) by mouth daily. 90 tablet 3   FLUoxetine (PROZAC) 20 MG tablet Take 1 tablet (20 mg total) by  mouth daily. 30 tablet 3   ibuprofen (ADVIL) 600 MG tablet ibuprofen 600 mg tablet  TAKE 1 TABLET BY MOUTH EVERY 8 HOURS AS NEEDED     LORazepam (ATIVAN) 1 MG tablet Take 1 tablet (1 mg total) by mouth as needed for anxiety (for flying). 30 tablet 0   meloxicam (MOBIC) 15 MG tablet Take 1 tablet (15 mg total) by mouth daily as needed for pain. 90 tablet 0   rosuvastatin (CRESTOR) 10 MG tablet Take 1 tablet (10 mg total) by mouth daily. 90 tablet 3   valACYclovir (VALTREX) 500 MG tablet Take 1 tablet by mouth once daily as needed. 90 tablet 3   No current facility-administered medications on file prior to visit.    BP 120/80   Pulse 62   Temp 98.1 F (36.7 C) (Oral)   Ht 5\' 7"  (1.702 m)   Wt 262 lb (118.8 kg)   SpO2 97%   BMI 41.04 kg/m       Objective:   Physical Exam Vitals and nursing note  reviewed.  Constitutional:      Appearance: Normal appearance.  Cardiovascular:     Rate and Rhythm: Normal rate and regular rhythm.     Pulses: Normal pulses.     Heart sounds: Normal heart sounds.  Pulmonary:     Effort: Pulmonary effort is normal.     Breath sounds: Normal breath sounds.  Skin:    General: Skin is warm and dry.     Comments: 1 cm Seborrheic Keratosis noted on right lower abdomen   Neurological:     General: No focal deficit present.     Mental Status: She is alert and oriented to person, place, and time.  Psychiatric:        Mood and Affect: Mood normal.        Behavior: Behavior normal.        Thought Content: Thought content normal.        Judgment: Judgment normal.       Assessment & Plan:  1. Familial hypercholesterolemia (Primary) - Continue with Zetia. Will check lipid panel today. Consider decreasing dose of crestor vs taking it every other day.  - Lipid panel; Future - Hepatic function panel; Future - ezetimibe (ZETIA) 10 MG tablet; Take 1 tablet (10 mg total) by mouth daily.  Dispense: 90 tablet; Refill: 3  2. Seborrheic keratosis - Verbal consent obtained. Using cryotherapy three free thaw cycles were completed. Patient tolerated procedure well. After care instructions reviewed  Shirline Frees, NP

## 2023-12-10 ENCOUNTER — Encounter: Payer: Self-pay | Admitting: Adult Health

## 2023-12-31 ENCOUNTER — Other Ambulatory Visit: Payer: Self-pay | Admitting: Adult Health

## 2023-12-31 MED ORDER — REPATHA SURECLICK 140 MG/ML ~~LOC~~ SOAJ: 140.0000 mg | SUBCUTANEOUS | 2 refills | Status: DC

## 2023-12-31 MED ORDER — ESCITALOPRAM OXALATE 10 MG PO TABS: 10.0000 mg | ORAL_TABLET | Freq: Every day | ORAL | 1 refills | Status: DC

## 2023-12-31 NOTE — Telephone Encounter (Signed)
**Note De-identified  [place] Obfuscation** Please advise 

## 2024-01-02 NOTE — Telephone Encounter (Signed)
 Please advise on alternative.

## 2024-01-03 NOTE — Telephone Encounter (Signed)
Called pt multiple times no answer

## 2024-03-01 ENCOUNTER — Emergency Department (HOSPITAL_BASED_OUTPATIENT_CLINIC_OR_DEPARTMENT_OTHER)
Admission: EM | Admit: 2024-03-01 | Discharge: 2024-03-01 | Disposition: A | Discharge status: Home / Self Care | Payer: PRIVATE HEALTH INSURANCE | Attending: Emergency Medicine | Admitting: Emergency Medicine

## 2024-03-01 ENCOUNTER — Encounter (HOSPITAL_BASED_OUTPATIENT_CLINIC_OR_DEPARTMENT_OTHER): Payer: Self-pay | Admitting: Emergency Medicine

## 2024-03-01 ENCOUNTER — Emergency Department (HOSPITAL_BASED_OUTPATIENT_CLINIC_OR_DEPARTMENT_OTHER): Payer: PRIVATE HEALTH INSURANCE

## 2024-03-01 DIAGNOSIS — M545 Low back pain, unspecified: Secondary | ICD-10-CM | POA: Diagnosis not present

## 2024-03-01 DIAGNOSIS — R109 Unspecified abdominal pain: Secondary | ICD-10-CM

## 2024-03-01 DIAGNOSIS — R1032 Left lower quadrant pain: Secondary | ICD-10-CM | POA: Insufficient documentation

## 2024-03-01 LAB — COMPREHENSIVE METABOLIC PANEL WITH GFR
ALT: 9 U/L (ref 0–44)
AST: 14 U/L — ABNORMAL LOW (ref 15–41)
Albumin: 4.2 g/dL (ref 3.5–5.0)
Alkaline Phosphatase: 79 U/L (ref 38–126)
Anion gap: 12 (ref 5–15)
BUN: 8 mg/dL (ref 6–20)
CO2: 23 mmol/L (ref 22–32)
Calcium: 9 mg/dL (ref 8.9–10.3)
Chloride: 101 mmol/L (ref 98–111)
Creatinine, Ser: 0.69 mg/dL (ref 0.44–1.00)
GFR, Estimated: >60 mL/min (ref >60)
Glucose, Bld: 77 mg/dL (ref 70–99)
Potassium: 3.8 mmol/L (ref 3.5–5.1)
Sodium: 136 mmol/L (ref 135–145)
Total Bilirubin: 0.2 mg/dL (ref 0.0–1.2)
Total Protein: 7.4 g/dL (ref 6.5–8.1)

## 2024-03-01 LAB — URINALYSIS, MICROSCOPIC (REFLEX)

## 2024-03-01 LAB — CBC WITH DIFFERENTIAL/PLATELET
Abs Immature Granulocytes: 0.03 10*3/uL (ref 0.00–0.07)
Basophils Absolute: 0 10*3/uL (ref 0.0–0.1)
Basophils Relative: 1 %
Eosinophils Absolute: 0.1 10*3/uL (ref 0.0–0.5)
Eosinophils Relative: 1 %
HCT: 42.1 % (ref 36.0–46.0)
Hemoglobin: 14.1 g/dL (ref 12.0–15.0)
Immature Granulocytes: 0 %
Lymphocytes Relative: 36 %
Lymphs Abs: 3 10*3/uL (ref 0.7–4.0)
MCH: 27.1 pg (ref 26.0–34.0)
MCHC: 33.5 g/dL (ref 30.0–36.0)
MCV: 81 fL (ref 80.0–100.0)
Monocytes Absolute: 0.7 10*3/uL (ref 0.1–1.0)
Monocytes Relative: 9 %
Neutro Abs: 4.6 10*3/uL (ref 1.7–7.7)
Neutrophils Relative %: 53 %
Platelets: 248 10*3/uL (ref 150–400)
RBC: 5.2 MIL/uL — ABNORMAL HIGH (ref 3.87–5.11)
RDW: 13.2 % (ref 11.5–15.5)
WBC: 8.5 10*3/uL (ref 4.0–10.5)
nRBC: 0 % (ref 0.0–0.2)

## 2024-03-01 LAB — URINALYSIS, ROUTINE W REFLEX MICROSCOPIC
Bilirubin Urine: NEGATIVE
Glucose, UA: NEGATIVE mg/dL
Ketones, ur: NEGATIVE mg/dL
Leukocytes,Ua: NEGATIVE
Nitrite: NEGATIVE
Protein, ur: NEGATIVE mg/dL
Specific Gravity, Urine: 1.01 (ref 1.005–1.030)
pH: 5.5 (ref 5.0–8.0)

## 2024-03-01 LAB — LIPASE, BLOOD: Lipase: 28 U/L (ref 11–51)

## 2024-03-01 MED ORDER — ONDANSETRON HCL 4 MG/2ML IJ SOLN
4.0000 mg | Freq: Once | INTRAMUSCULAR | Status: AC
  Administered 2024-03-01: 4 mg via INTRAVENOUS
  Filled 2024-03-01: qty 2

## 2024-03-01 MED ORDER — IOHEXOL 300 MG/ML  SOLN
100.0000 mL | Freq: Once | INTRAMUSCULAR | Status: AC | PRN
  Administered 2024-03-01: 100 mL via INTRAVENOUS

## 2024-03-01 MED ORDER — FENTANYL CITRATE PF 50 MCG/ML IJ SOSY
50.0000 ug | PREFILLED_SYRINGE | Freq: Once | INTRAMUSCULAR | Status: AC
  Administered 2024-03-01: 50 ug via INTRAVENOUS
  Filled 2024-03-01: qty 1

## 2024-03-01 MED ORDER — METHYLPREDNISOLONE 4 MG PO TBPK: ORAL_TABLET | ORAL | 0 refills | Status: DC

## 2024-03-01 NOTE — Discharge Instructions (Signed)
 As this does you have a nonspecific area of enhancement in your endometrium that likely correlates with your prior ablation.  I would recommend talking with your OB doctor about maybe getting a pelvic ultrasound outpatient.  Otherwise workup today is unremarkable.  I prescribed you Medrol  Dosepak to treat possible inflammation from a nerve in your back.  Follow-up with your primary care doctor as well for this.

## 2024-03-01 NOTE — ED Triage Notes (Signed)
 Pt reports LLQ pain intermittently x 4-6 wks; denies NVD, pain radiates to back and down LLE at times

## 2024-03-01 NOTE — ED Provider Notes (Signed)
 [facility] Provider Note   CSN: [id number] Arrival date & time: 03/01/24  1358     History  Chief Complaint  Patient presents with   Abdominal Pain    Pamela Hall is a 38 y.o. female.  Patient here with left lower quadrant pain on and off for the last couple weeks.  Does have some back pain pain radiates down the leg as well at times.  Denies any loss of bowel or bladder.  Denies any nausea vomit diarrhea.  No chest pain, no shortness of breath, no weakness numbness tingling.  Patient denies any trauma.  Has had uterine ablation.  The history is provided by the patient.       Home Medications Prior to Admission medications   Medication Sig Start Date End Date Taking? Authorizing Provider  methylPREDNISolone  (MEDROL  DOSEPAK) 4 MG TBPK tablet Follow package insert 03/01/24  Yes Jake Fuhrmann, DO  escitalopram  (LEXAPRO ) 10 MG tablet Take 1 tablet (10 mg total) by mouth daily. 12/31/23   Nafziger, Randel Buss, NP  Evolocumab  (REPATHA  SURECLICK) 140 MG/ML SOAJ Inject 140 mg into the skin every 14 (fourteen) days. 12/31/23   Nafziger, Randel Buss, NP  ezetimibe  (ZETIA ) 10 MG tablet Take 1 tablet (10 mg total) by mouth daily. 12/06/23   Nafziger, Randel Buss, NP  ibuprofen  (ADVIL ) 600 MG tablet ibuprofen  600 mg tablet  TAKE 1 TABLET BY MOUTH EVERY 8 HOURS AS NEEDED    [provider]  LORazepam  (ATIVAN ) 1 MG tablet Take 1 tablet (1 mg total) by mouth as needed for anxiety (for flying). 11/22/23   Nafziger, Randel Buss, NP  meloxicam  (MOBIC ) 15 MG tablet Take 1 tablet (15 mg total) by mouth daily as needed for pain. 11/22/23   Nafziger, Randel Buss, NP  rosuvastatin  (CRESTOR ) 10 MG tablet Take 1 tablet (10 mg total) by mouth daily. 09/25/23   Nafziger, Randel Buss, NP  valACYclovir  (VALTREX ) 500 MG tablet Take 1 tablet by mouth once daily as needed. 03/17/21   Nafziger, Randel Buss, NP      Allergies    Statins    Review of Systems   Review of  Systems  Physical Exam Updated Vital Signs BP (!) 114/54 (BP Location: Right Arm)   Pulse (!) 59   Temp 97.7 F (36.5 C) (Oral)   Resp 17   Ht 5\' 7"  (1.702 m)   Wt 118.8 kg   SpO2 100%   BMI 41.02 kg/m  Physical Exam Vitals and nursing note reviewed.  Constitutional:      General: She is not in acute distress.    Appearance: She is well-developed. She is not ill-appearing.  HENT:     Head: Normocephalic and atraumatic.     Mouth/Throat:     Mouth: Mucous membranes are moist.  Eyes:     Extraocular Movements: Extraocular movements intact.     Conjunctiva/sclera: Conjunctivae normal.     Pupils: Pupils are equal, round, and reactive to light.  Cardiovascular:     Rate and Rhythm: Normal rate and regular rhythm.     Heart sounds: Normal heart sounds. No murmur heard. Pulmonary:     Effort: Pulmonary effort is normal. No respiratory distress.     Breath sounds: Normal breath sounds.  Abdominal:     Palpations: Abdomen is soft.     Tenderness: There is abdominal tenderness in the left lower quadrant.  Musculoskeletal:        General: No swelling.     Cervical  back: Neck supple.     Comments: Tenderness to the paraspinal lumbar muscles on the left  Skin:    General: Skin is warm and dry.     Capillary Refill: Capillary refill takes less than 2 seconds.  Neurological:     Mental Status: She is alert.  Psychiatric:        Mood and Affect: Mood normal.     ED Results / Procedures / Treatments   Labs (all labs ordered are listed, but only abnormal results are displayed) Labs Reviewed  CBC WITH DIFFERENTIAL/PLATELET - Abnormal; Notable for the following components:      Result Value   RBC 5.20 (*)    All other components within normal limits  COMPREHENSIVE METABOLIC PANEL WITH GFR - Abnormal; Notable for the following components:   AST 14 (*)    All other components within normal limits  URINALYSIS, ROUTINE W REFLEX MICROSCOPIC - Abnormal; Notable for the following  components:   Hgb urine dipstick TRACE (*)    All other components within normal limits  URINALYSIS, MICROSCOPIC (REFLEX) - Abnormal; Notable for the following components:   Bacteria, UA FEW (*)    All other components within normal limits  LIPASE, BLOOD    EKG None  Radiology CT ABDOMEN PELVIS W CONTRAST Result Date: 03/01/2024 CLINICAL DATA:  Abdominal pain, acute, nonlocalized EXAM: CT ABDOMEN AND PELVIS WITH CONTRAST TECHNIQUE: Multidetector CT imaging of the abdomen and pelvis was performed using the standard protocol following bolus administration of intravenous contrast. RADIATION DOSE REDUCTION: This exam was performed according to the departmental dose-optimization program which includes automated exposure control, adjustment of the mA and/or kV according to patient size and/or use of iterative reconstruction technique. CONTRAST:  100mL OMNIPAQUE IOHEXOL 300 MG/ML  SOLN COMPARISON:  None Available. FINDINGS: Lower chest: No acute abnormality. Hepatobiliary: Liver and gallbladder are unremarkable. No intrahepatic or extrahepatic biliary ductal dilation. Pancreas: Unremarkable. No pancreatic ductal dilatation or surrounding inflammatory changes. Spleen: Normal in size without focal abnormality. Adrenals/Urinary Tract: Adrenal glands are unremarkable. Kidneys enhance symmetrically. No hydronephrosis. No obstructing nephrolithiasis. Bladder is unremarkable. Stomach/Bowel: No evidence of bowel obstruction. Appendix is normal. Mildly prominent folds of the stomach. Vascular/Lymphatic: No significant vascular findings are present. No enlarged abdominal or pelvic lymph nodes. Reproductive: There are 2 hypodense oval areas in the expected region of the endometrium with adjacent faint enhancement. They span approximately 10-11 mm in thickness. Ovaries are unremarkable. Other: No free air or free fluid. Musculoskeletal: Limbus vertebra of L4. IMPRESSION: 1. Mildly prominent folds of the stomach. This is  nonspecific but could reflect gastritis. 2. There are 2 hypodense oval areas in the expected region of the endometrium with adjacent faint enhancement. These are nonspecific and could reflect scarring of the endometrial canal or an etiology such polyps. Recommend correlation with history of vaginal bleeding or ablation procedure. This would be better assessed with dedicated pelvic ultrasound when clinically appropriate. Electronically Signed   By: Clancy Crimes M.D.   On: 03/01/2024 17:09    Procedures Procedures    Medications Ordered in ED Medications  fentaNYL  (SUBLIMAZE ) injection 50 mcg (50 mcg Intravenous Given 03/01/24 1523)  iohexol (OMNIPAQUE) 300 MG/ML solution 100 mL (100 mLs Intravenous Contrast Given 03/01/24 1628)  ondansetron  (ZOFRAN ) injection 4 mg (4 mg Intravenous Given 03/01/24 1657)    ED Course/ Medical Decision Making/ A&P  Medical Decision Making Amount and/or Complexity of Data Reviewed Labs: ordered. Radiology: ordered.  Risk Prescription drug management.   [facility] is here with left lower abdominal pain left lower back pain for the last couple weeks on and off.  Sometimes pain radiates down her left leg.  Denies any nausea vomiting diarrhea.  Denies any loss of bowel or bladder.  Denies any trauma.  She has had a uterine ablation.  She denies any vaginal bleeding or vaginal discharge or pain with urination.  Differential diagnosis could be musculoskeletal could be diverticulitis seems less likely to be bowel obstruction.  Seems less likely to be ovarian cyst or torsion or UTI.  Will get CBC CMP lipase urinalysis CT scan abdomen pelvis.  Per my review and interpretation the labs there is no significant leukocytosis anemia or electrolyte abnormality or kidney injury.  CT scan shows some mildly prominent folds of the stomach.  Nonspecific.  I do not think clinically she has gastritis.  They also saw 2  hypodense oval areas in the endometrium.  She has had ablation procedure in the past and I suspect that is likely what they are seen.  I discussed this with the patient and recommend that she talk with her OB doctor to see if they want to pursue a pelvic ultrasound outpatient or if they agree if it is from likely prior ablation procedure.  Overall I do think her pain is likely from her back and will prescribe a Medrol  dose pack.  Have no concern for cauda equina or other acute process.  Patient discharged in good condition.  This chart was dictated using voice recognition software.  Despite best efforts to proofread,  errors can occur which can change the documentation meaning.         Final Clinical Impression(s) / ED Diagnoses Final diagnoses:  Abdominal pain, unspecified abdominal location  Acute left-sided low back pain, unspecified whether sciatica present    Rx / DC Orders ED Discharge Orders          Ordered    methylPREDNISolone  (MEDROL  DOSEPAK) 4 MG TBPK tablet        03/01/24 1858              Lowery Rue, DO 03/01/24 1900

## 2024-03-10 ENCOUNTER — Encounter: Payer: Self-pay | Admitting: Adult Health

## 2024-03-10 ENCOUNTER — Ambulatory Visit (INDEPENDENT_AMBULATORY_CARE_PROVIDER_SITE_OTHER): Payer: PRIVATE HEALTH INSURANCE | Admitting: Adult Health

## 2024-03-10 VITALS — BP 110/80 | HR 60 | Temp 98.1°F | Ht 67.0 in | Wt 261.0 lb

## 2024-03-10 DIAGNOSIS — R1032 Left lower quadrant pain: Secondary | ICD-10-CM | POA: Diagnosis not present

## 2024-03-10 DIAGNOSIS — E7801 Familial hypercholesterolemia: Secondary | ICD-10-CM

## 2024-03-10 DIAGNOSIS — E66812 Obesity, class 2: Secondary | ICD-10-CM

## 2024-03-10 DIAGNOSIS — Z6841 Body Mass Index (BMI) 40.0 and over, adult: Secondary | ICD-10-CM

## 2024-03-10 DIAGNOSIS — M545 Low back pain, unspecified: Secondary | ICD-10-CM

## 2024-03-10 MED ORDER — TIRZEPATIDE-WEIGHT MANAGEMENT 2.5 MG/0.5ML ~~LOC~~ SOLN: 2.5000 mg | SUBCUTANEOUS | 0 refills | Status: DC

## 2024-03-10 MED ORDER — REPATHA SURECLICK 140 MG/ML ~~LOC~~ SOAJ: 140.0000 mg | SUBCUTANEOUS | 2 refills | Status: DC

## 2024-03-10 NOTE — Progress Notes (Signed)
 Subjective:    Patient ID: Pamela Hall, female    DOB: 1986/02/28, 38 y.o.   MRN: [id number]  HPI 38 year old female who  has a past medical history of Abnormal Pap smear (2008), Anxiety and depression, and High cholesterol.  She presents to the office today for follow-up after being seen in the emergency room 9 days ago for left lower quadrant pain on and off for at least a few weeks.  She did have some back pain that radiated down her leg at times.  Denied any bowel or bladder incontinence.  Denied nausea, vomiting, or diarrhea, or vaginal bleeding.  She did not have any chest pain, shortness of breath.  Exam she did have abdominal tenderness in the left lower quadrant as well as tenderness to the paraspinal lumbar muscles on the left side.  Labs did not show any significant leukocytosis, anemia, early abnormality or kidney injury.  Her CT scan showed mildly prominent folds of the stomach, nonspecific but could reflect gastritis.  She was not having gastritis symptoms in the emergency room.  There were also 2 hypodense oval areas in the expected region of the endometrium with adjacent faint enhancement which corresponded with her previous uterine ablation.  Thought that her pain was likely from her back and was prescribed a Medrol  Dosepak.  She was advised to follow-up with her OB doctor to see if they wanted to pursue a pelvic ultrasound outpatient.  Today she reports that she does have an appointment with her GYN at the end of June.  She has been taking Tylenol  and Motrin  and her symptoms seem to improve and she is feeling much better in the way of her pain.  Other more she has a family history of hypercholesterolemia does take Crestor  10 mg daily and Zetia  10 mg daily but does get muscle aches with these medications.  She has no insurance and would like to see if Repatha  is covered under her new insurance.  She has been on it in the past and did well with this  medication.  Also has a history of obesity, would like to start Zepbound.   Review of Systems See HPI   Past Medical History:  Diagnosis Date   Abnormal Pap smear 2008   COLPO;WAS NORMAL;LAST PAP 10/2011   Anxiety and depression    High cholesterol     Social History   Socioeconomic History   Marital status: Married    Spouse name: Lourena Royal   Number of children: 1   Years of education: 17   Highest education level: Not on file  Occupational History   Occupation: NP     Employer: [employer]  Tobacco Use   Smoking status: Never    Passive exposure: Past   Smokeless tobacco: Never  Vaping Use   Vaping status: Never Used  Substance and Sexual Activity   Alcohol use: No    Alcohol/week: 1.0 - 2.0 standard drink of alcohol    Types: 1 - 2 Glasses of wine per week    Comment: SOCIAL DRINKER   Drug use: No   Sexual activity: Not Currently    Partners: Male    Birth control/protection: Surgical  Other Topics Concern   Not on file  Social History Narrative   Not on file   Social Drivers of Health   Financial Resource Strain: Not on file  Food Insecurity: Not on file  Transportation Needs: Not on file  Physical Activity: Not  on file  Stress: Not on file  Social Connections: Not on file  Intimate Partner Violence: Not on file    Past Surgical History:  Procedure Laterality Date   TUBAL LIGATION  09/30/2012   Procedure: POST PARTUM TUBAL LIGATION;  Surgeon: Norville Beery, MD;  Location: [facility];  Service: Gynecology;  Laterality: Bilateral;   WISDOM TOOTH EXTRACTION  2003   ALL 4 EXTRACTED    Family History  Problem Relation Age of Onset   Diabetes type II Maternal Grandmother    Hypertension Maternal Grandmother    Heart disease Maternal Grandmother    Anesthesia problems Maternal Grandmother        ALLERGIC TO GENERAL ANESTHESIA   Other Maternal Grandmother    Breast cancer Maternal Grandmother    Schizophrenia Mother    Bipolar disorder Mother     Heart disease Mother    Heart attack Mother     Allergies  Allergen Reactions   Statins     Pravastatin and Crestor      Current Outpatient Medications on File Prior to Visit  Medication Sig Dispense Refill   escitalopram  (LEXAPRO ) 10 MG tablet Take 1 tablet (10 mg total) by mouth daily. 90 tablet 1   Evolocumab  (REPATHA  SURECLICK) 140 MG/ML SOAJ Inject 140 mg into the skin every 14 (fourteen) days. 2 mL 2   ezetimibe  (ZETIA ) 10 MG tablet Take 1 tablet (10 mg total) by mouth daily. 90 tablet 3   ibuprofen  (ADVIL ) 600 MG tablet ibuprofen  600 mg tablet  TAKE 1 TABLET BY MOUTH EVERY 8 HOURS AS NEEDED     LORazepam  (ATIVAN ) 1 MG tablet Take 1 tablet (1 mg total) by mouth as needed for anxiety (for flying). 30 tablet 0   meloxicam  (MOBIC ) 15 MG tablet Take 1 tablet (15 mg total) by mouth daily as needed for pain. 90 tablet 0   methylPREDNISolone  (MEDROL  DOSEPAK) 4 MG TBPK tablet Follow package insert 21 each 0   rosuvastatin  (CRESTOR ) 10 MG tablet Take 1 tablet (10 mg total) by mouth daily. 90 tablet 3   valACYclovir  (VALTREX ) 500 MG tablet Take 1 tablet by mouth once daily as needed. 90 tablet 3   No current facility-administered medications on file prior to visit.    BP 110/80   Pulse 60   Temp 98.1 F (36.7 C) (Oral)   Ht 5\' 7"  (1.702 m)   Wt 261 lb (118.4 kg)   SpO2 97%   BMI 40.88 kg/m       Objective:   Physical Exam Vitals and nursing note reviewed.  Constitutional:      Appearance: Normal appearance. She is obese.  Cardiovascular:     Rate and Rhythm: Normal rate and regular rhythm.     Pulses: Normal pulses.     Heart sounds: Normal heart sounds.  Pulmonary:     Effort: Pulmonary effort is normal.     Breath sounds: Normal breath sounds.  Abdominal:     General: Abdomen is flat. Bowel sounds are normal. There is no distension.     Palpations: Abdomen is soft.     Tenderness: There is no abdominal tenderness.  Musculoskeletal:        General: No swelling  or tenderness.  Skin:    General: Skin is warm and dry.  Neurological:     General: No focal deficit present.     Mental Status: She is alert and oriented to person, place, and time.  Psychiatric:  Mood and Affect: Mood normal.        Behavior: Behavior normal.        Thought Content: Thought content normal.        Judgment: Judgment normal.        Assessment & Plan:  1. LLQ pain (Primary) - Improved. Can take Motrin  if needed - Follow up with GYN   2. Acute midline low back pain without sciatica - Resolving. Can continue with Motrin  PRN   3. Class 2 obesity - Follow up in 1 month after starting  - tirzepatide  (ZEPBOUND ) 2.5 MG/0.5ML injection vial; Inject 2.5 mg into the skin once a week.  Dispense: 2 mL; Refill: 0  4. BMI 40.0-44.9, adult (HCC)  - tirzepatide  (ZEPBOUND ) 2.5 MG/0.5ML injection vial; Inject 2.5 mg into the skin once a week.  Dispense: 2 mL; Refill: 0 - Follow up in 1 month after starting   5. Familial hypercholesterolemia  - Evolocumab  (REPATHA  SURECLICK) 140 MG/ML SOAJ; Inject 140 mg into the skin every 14 (fourteen) days.  Dispense: 2 mL; Refill: 2  Alto Atta, NP

## 2024-03-12 ENCOUNTER — Telehealth: Payer: Self-pay

## 2024-03-12 ENCOUNTER — Other Ambulatory Visit (HOSPITAL_COMMUNITY): Payer: Self-pay

## 2024-03-12 NOTE — Telephone Encounter (Signed)
 Pharmacy Patient Advocate Encounter   Received notification from CoverMyMeds that prior authorization for Repatha  Sureclick is required/requested.   Insurance verification completed.   The patient is insured through [employer]/ [employer] .   Per test claim: PA required and submitted KEY/EOC/Request #: [id number] APPROVED from 03/12/24 to 10/14/38. Ran test claim, Copay is $45.00. This test claim was processed through [facility]- copay amounts may vary at other pharmacies due to pharmacy/plan contracts, or as the patient moves through the different stages of their insurance plan.

## 2024-03-12 NOTE — Telephone Encounter (Signed)
 Left detailed message informing  of update.

## 2024-03-13 ENCOUNTER — Other Ambulatory Visit (HOSPITAL_COMMUNITY): Payer: Self-pay

## 2024-03-13 ENCOUNTER — Telehealth: Payer: Self-pay

## 2024-03-13 NOTE — Telephone Encounter (Signed)
 Pharmacy Patient Advocate Encounter   Received notification from Onbase that prior authorization for Zepbound 2.5MG /0.5ML pen-injectors is required/requested.   Insurance verification completed.   The patient is insured through [facility] .   Per test claim: PA required; PA submitted to above mentioned insurance via CoverMyMeds Key/confirmation #/EOC Zepbound 2.5MG /0.5ML pen-injectors Status is pending

## 2024-03-19 NOTE — Telephone Encounter (Signed)
 Pharmacy Patient Advocate Encounter  Received notification from [employer] that Prior Authorization for Zepbound  2.5MG /0.5ML has been APPROVED from 03/13/24 to 09/09/24   PA #/Case ID/Reference #: [id number]

## 2024-04-08 ENCOUNTER — Other Ambulatory Visit: Payer: Self-pay

## 2024-04-08 ENCOUNTER — Other Ambulatory Visit (HOSPITAL_COMMUNITY)
Admission: RE | Admit: 2024-04-08 | Discharge: 2024-04-08 | Disposition: A | Discharge status: Home / Self Care | Payer: PRIVATE HEALTH INSURANCE | Source: Ambulatory Visit | Attending: Family Medicine | Admitting: Family Medicine

## 2024-04-08 ENCOUNTER — Ambulatory Visit: Payer: PRIVATE HEALTH INSURANCE | Admitting: Obstetrics and Gynecology

## 2024-04-08 ENCOUNTER — Encounter: Payer: Self-pay | Admitting: Obstetrics and Gynecology

## 2024-04-08 VITALS — BP 123/67 | HR 69 | Ht 67.0 in | Wt 257.8 lb

## 2024-04-08 DIAGNOSIS — Z9889 Other specified postprocedural states: Secondary | ICD-10-CM

## 2024-04-08 DIAGNOSIS — N882 Stricture and stenosis of cervix uteri: Secondary | ICD-10-CM

## 2024-04-08 DIAGNOSIS — R102 Pelvic and perineal pain: Secondary | ICD-10-CM | POA: Diagnosis present

## 2024-04-08 DIAGNOSIS — Z1331 Encounter for screening for depression: Secondary | ICD-10-CM | POA: Diagnosis not present

## 2024-04-08 DIAGNOSIS — Z124 Encounter for screening for malignant neoplasm of cervix: Secondary | ICD-10-CM | POA: Insufficient documentation

## 2024-04-08 DIAGNOSIS — Z6841 Body Mass Index (BMI) 40.0 and over, adult: Secondary | ICD-10-CM | POA: Insufficient documentation

## 2024-04-08 DIAGNOSIS — Z3202 Encounter for pregnancy test, result negative: Secondary | ICD-10-CM | POA: Diagnosis not present

## 2024-04-08 DIAGNOSIS — G4730 Sleep apnea, unspecified: Secondary | ICD-10-CM | POA: Insufficient documentation

## 2024-04-08 LAB — POCT PREGNANCY, URINE: Preg Test, Ur: NEGATIVE

## 2024-04-08 MED ORDER — NORETHINDRONE 0.35 MG PO TABS: 1.0000 | ORAL_TABLET | Freq: Every day | ORAL | 1 refills | Status: AC

## 2024-04-08 MED ORDER — KETOROLAC TROMETHAMINE 10 MG PO TABS: 10.0000 mg | ORAL_TABLET | Freq: Four times a day (QID) | ORAL | 0 refills | Status: AC | PRN

## 2024-04-08 NOTE — Progress Notes (Unsigned)
 Obstetrics and Gynecology New Patient*** Evaluation  Appointment Date: 04/08/2024  OBGYN Clinic: [facility]***  Primary Care Provider: Merna Huxley  Referring Provider: Merna Huxley, NP  Chief Complaint: No chief complaint on file.   History of Present Illness: Pamela Hall is a 38 y.o. {Race:20311} [phone] (Patient's last menstrual period was 03/31/2024 (within days).), seen for the above chief complaint. 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    {Common ambulatory SmartLinks:19316}  Past Medical History:  Past Medical History:  Diagnosis Date   Abnormal Pap smear 2008   COLPO;WAS NORMAL;LAST PAP 10/2011   Anxiety and depression    High cholesterol    Past Surgical History:  Past Surgical History:  Procedure Laterality Date   TUBAL LIGATION  09/30/2012   Procedure: POST PARTUM TUBAL LIGATION;  Surgeon: Ovid DELENA All, MD;  Location: [facility];  Service: Gynecology;  Laterality: Bilateral;   WISDOM TOOTH EXTRACTION  10/15/2001   ALL 4 EXTRACTED   Past Obstetrical History:  OB History  Gravida Para Term Preterm AB Living  3 2 2  1 2   SAB IAB Ectopic Multiple Live Births      1    # Outcome Date GA Lbr Len/2nd Weight Sex Type Anes PTL Lv  3 Term 09/29/12 [redacted]w[redacted]d / 00:05 5 lb 14.4 oz (2.676 kg) F Vag-Spont EPI  LIV  2 Term 06/01/09 [redacted]w[redacted]d 10:00 6 lb 14 oz (3.118 kg) M Vag-Spont EPI        Birth Comments: NO COMPLICATIONS  1 AB  [redacted]w[redacted]d            Birth Comments: NO COMPLICATIONS    SVD x ***, Cesarean section x *** Past Gynecological History: As per HPI. Menarche age *** Periods: *** History of Pap Smear(s): {YES WN:685467} Last pap ***, which was *** History of STI(s): {YES WN:685467} She is currently using {PLAN CONTRACEPTION:313102} for contraception.  History of HRT use: {YES NO:314532} HPV vaccine series complete: {YES/NO/NOT APPLICABLE:20182} Social History:  Social History   Socioeconomic History   Marital status: Married    Spouse name: [name]   Number of children: 1   Years of education: 17   Highest education level: Not on file  Occupational History   Occupation: NP     Employer: [employer]  Tobacco Use   Smoking status: Never    Passive exposure: Past   Smokeless tobacco: Never  Vaping Use   Vaping status: Never Used  Substance and Sexual Activity   Alcohol use: Yes    Alcohol/week: 1.0 - 2.0 standard drink of alcohol    Types: 1 - 2 Glasses of wine per week    Comment: SOCIAL DRINKER   Drug use: No   Sexual activity: Yes    Partners: Male    Birth control/protection: Surgical  Other Topics Concern   Not on file  Social History Narrative   Not on file   Social Drivers of Health   Financial Resource Strain:  Not on file  Food Insecurity: Not on file  Transportation Needs: Not on file  Physical Activity: Not on file  Stress: Not on file  Social Connections: Not on file  Intimate Partner Violence: Not on file   Family History:  Family History  Problem Relation Age of Onset   Diabetes type II Maternal Grandmother    Hypertension Maternal Grandmother    Heart disease Maternal Grandmother    Anesthesia problems Maternal Grandmother        ALLERGIC TO GENERAL ANESTHESIA   Other Maternal Grandmother    Breast cancer Maternal Grandmother    Schizophrenia Mother    Bipolar disorder Mother    Heart disease Mother    Heart  attack Mother    She *** any female cancers, bleeding or blood clotting disorders.   Health Maintenance:  Mammogram(s): {YES WN:685467} Date: *** Colonoscopy: {YES WN:685467} Date: *** Flu shot UTD:  {YES/NO/NOT APPLICABLE:20182}  Medications Yusra N. Doles Johnson had no medications administered during this visit. Current Outpatient Medications  Medication Sig Dispense Refill   escitalopram  (LEXAPRO ) 10 MG tablet Take 1 tablet (10 mg total) by mouth daily. 90 tablet 1   Evolocumab  (REPATHA  SURECLICK) 140 MG/ML SOAJ Inject 140 mg into the skin every 14 (fourteen) days. 2 mL 2   ezetimibe  (ZETIA ) 10 MG tablet Take 1 tablet (10 mg total) by mouth daily. 90 tablet 3   LORazepam  (ATIVAN ) 1 MG tablet Take 1 tablet (1 mg total) by mouth as needed for anxiety (for flying). 30 tablet 0   meloxicam  (MOBIC ) 15 MG tablet Take 1 tablet (15 mg total) by mouth daily as needed for pain. 90 tablet 0   tirzepatide  (ZEPBOUND ) 2.5 MG/0.5ML injection vial Inject 2.5 mg into the skin once a week. 2 mL 0   valACYclovir  (VALTREX ) 500 MG tablet Take 1 tablet by mouth once daily as needed. 90 tablet 3   ibuprofen  (ADVIL ) 600 MG tablet ibuprofen  600 mg tablet  TAKE 1 TABLET BY MOUTH EVERY 8 HOURS AS NEEDED (Patient not taking: Reported on 04/08/2024)     methylPREDNISolone  (MEDROL  DOSEPAK) 4 MG TBPK tablet Follow package insert (Patient not taking: Reported on 04/08/2024) 21 each 0   rosuvastatin  (CRESTOR ) 10 MG tablet Take 1 tablet (10 mg total) by mouth daily. (Patient not taking: Reported on 04/08/2024) 90 tablet 3   No current facility-administered medications for this visit.   Allergies Statins  Physical Exam:  BP 123/67   Pulse 69   Ht 5' 7 (1.702 m)   Wt 257 lb 12.8 oz (116.9 kg)   LMP 03/31/2024 (Within Days) Comment: light spotting  BMI 40.38 kg/m  Body mass index is 40.38 kg/m. Weight last year: *** General appearance: Well nourished, well developed female in no acute distress.  Neck:  Supple,  normal appearance, and no thyromegaly  Cardiovascular: normal s1 and s2.  No murmurs, rubs or gallops. Respiratory:  Clear to auscultation bilateral. Normal respiratory effort Abdomen: positive bowel sounds and no masses, hernias; diffusely non tender to palpation, non distended Breasts: {pe breast exam:315056::breasts appear normal, no suspicious masses, no skin or nipple changes or axillary nodes}. Neuro/Psych:  Normal mood and affect.  Skin:  Warm and dry.  Lymphatic:  No inguinal lymphadenopathy.   {Blank single:19197::Cervical exam performed in the presence of a chaperone,Cervical exam deferred} Pelvic exam: {ACTION; IS/IS WNU:78978602} limited by body habitus EGBUS: within normal limits Vagina: within normal limits and with {no/min/mod:60509} blood or discharge in the vault Cervix: normal appearing  cervix without tenderness, discharge or lesions. IUD strings *** Uterus:  {Desc; uterus-size & shape:16618} and non tender Adnexa:  {exam; adnexa:12223} Rectovaginal: ***  Laboratory: ***  Radiology: ***  Assessment: ***  Plan: *** 1. History of endometrial ablation (Primary) *** - US  PELVIC COMPLETE WITH TRANSVAGINAL; Future  2. Pelvic pain *** - US  PELVIC COMPLETE WITH TRANSVAGINAL; Future - Cytology - PAP( [place])  3. Cervical cancer screening *** - Cytology - PAP( [place])  Orders Placed This Encounter  Procedures   US  PELVIC COMPLETE WITH TRANSVAGINAL    RTC ***  No follow-ups on file.  No future appointments.  Bebe Izell Raddle MD Attending [facility])

## 2024-04-09 ENCOUNTER — Telehealth: Payer: PRIVATE HEALTH INSURANCE | Admitting: Adult Health

## 2024-04-09 ENCOUNTER — Encounter: Payer: Self-pay | Admitting: Obstetrics and Gynecology

## 2024-04-09 DIAGNOSIS — Z6841 Body Mass Index (BMI) 40.0 and over, adult: Secondary | ICD-10-CM | POA: Diagnosis not present

## 2024-04-09 DIAGNOSIS — N882 Stricture and stenosis of cervix uteri: Secondary | ICD-10-CM | POA: Insufficient documentation

## 2024-04-09 DIAGNOSIS — Z9889 Other specified postprocedural states: Secondary | ICD-10-CM | POA: Insufficient documentation

## 2024-04-09 DIAGNOSIS — E66812 Obesity, class 2: Secondary | ICD-10-CM

## 2024-04-09 DIAGNOSIS — E7801 Familial hypercholesterolemia: Secondary | ICD-10-CM | POA: Diagnosis not present

## 2024-04-09 MED ORDER — TIRZEPATIDE-WEIGHT MANAGEMENT 5 MG/0.5ML ~~LOC~~ SOLN: 5.0000 mg | SUBCUTANEOUS | 0 refills | Status: AC

## 2024-04-09 NOTE — Progress Notes (Deleted)
 Subjective:    Patient ID: Pamela Hall, female    DOB: November 08, 1985, 38 y.o.   MRN: [id number]  HPI 38 year old female who  has a past medical history of Abnormal Pap smear (2008), Anxiety and depression, and High cholesterol.  She is being evaluated today for follow-up regarding obesity and weight loss management.  Roughly a month ago she was started on Zepbound  2.5 mg.  She reports that she has been tolerating this medication well without any side effects.  Wt Readings from Last 3 Encounters:  04/08/24 257 lb 12.8 oz (116.9 kg)  03/10/24 261 lb (118.4 kg)  03/01/24 261 lb 14.5 oz (118.8 kg)      Review of Systems See HPI   Past Medical History:  Diagnosis Date   Abnormal Pap smear 2008   COLPO;WAS NORMAL;LAST PAP 10/2011   Anxiety and depression    High cholesterol     Social History   Socioeconomic History   Marital status: Married    Spouse name: Pamela Hall   Number of children: 1   Years of education: 17   Highest education level: Not on file  Occupational History   Occupation: NP     Employer: [employer]  Tobacco Use   Smoking status: Never    Passive exposure: Past   Smokeless tobacco: Never  Vaping Use   Vaping status: Never Used  Substance and Sexual Activity   Alcohol use: Yes    Alcohol/week: 1.0 - 2.0 standard drink of alcohol    Types: 1 - 2 Glasses of wine per week    Comment: SOCIAL DRINKER   Drug use: No   Sexual activity: Yes    Partners: Male    Birth control/protection: Surgical  Other Topics Concern   Not on file  Social History Narrative   Not on file   Social Drivers of Health   Financial Resource Strain: Not on file  Food Insecurity: No Food Insecurity (04/08/2024)   Hunger Vital Sign    Worried About Running Out of Food in the Last Year: Never true    Ran Out of Food in the Last Year: Never true  Transportation Needs: No Transportation Needs (04/08/2024)   PRAPARE - Scientist, research (physical sciences) (Medical): No    Lack of Transportation (Non-Medical): No  Physical Activity: Not on file  Stress: Not on file  Social Connections: Not on file  Intimate Partner Violence: Not on file    Past Surgical History:  Procedure Laterality Date   HYSTEROSCOPY W/ ENDOMETRIAL ABLATION  10/2021   [facility]   TUBAL LIGATION  09/30/2012   Procedure: POST PARTUM TUBAL LIGATION;  Surgeon: Pamela DELENA All, MD;  Location: [facility];  Service: Gynecology;  Laterality: Bilateral;   WISDOM TOOTH EXTRACTION  10/15/2001   [facility] 4 EXTRACTED    Family History  Problem Relation Age of Onset   Diabetes type II Maternal Grandmother    Hypertension Maternal Grandmother    Heart disease Maternal Grandmother    Anesthesia problems Maternal Grandmother        ALLERGIC TO GENERAL ANESTHESIA   Other Maternal Grandmother    Breast cancer Maternal Grandmother    Schizophrenia Mother    Bipolar disorder Mother    Heart disease Mother    Heart attack Mother     Allergies  Allergen Reactions   Statins     Pravastatin and Crestor      Current Outpatient Medications  on File Prior to Visit  Medication Sig Dispense Refill   escitalopram  (LEXAPRO ) 10 MG tablet Take 1 tablet (10 mg total) by mouth daily. 90 tablet 1   Evolocumab  (REPATHA  SURECLICK) 140 MG/ML SOAJ Inject 140 mg into the skin every 14 (fourteen) days. 2 mL 2   ezetimibe  (ZETIA ) 10 MG tablet Take 1 tablet (10 mg total) by mouth daily. 90 tablet 3   ketorolac (TORADOL) 10 MG tablet Take 1 tablet (10 mg total) by mouth every 6 (six) hours as needed. 20 tablet 0   LORazepam  (ATIVAN ) 1 MG tablet Take 1 tablet (1 mg total) by mouth as needed for anxiety (for flying). 30 tablet 0   methylPREDNISolone  (MEDROL  DOSEPAK) 4 MG TBPK tablet Follow package insert (Patient not taking: Reported on 04/08/2024) 21 each 0   norethindrone (CAMILA) 0.35 MG tablet Take 1 tablet (0.35 mg total) by mouth daily. Take at the same time every day 90 tablet  1   rosuvastatin  (CRESTOR ) 10 MG tablet Take 1 tablet (10 mg total) by mouth daily. (Patient not taking: Reported on 04/08/2024) 90 tablet 3   tirzepatide  (ZEPBOUND ) 2.5 MG/0.5ML injection vial Inject 2.5 mg into the skin once a week. 2 mL 0   valACYclovir  (VALTREX ) 500 MG tablet Take 1 tablet by mouth once daily as needed. 90 tablet 3   No current facility-administered medications on file prior to visit.    LMP 03/31/2024 (Within Days) Comment: light spotting      Objective:   Physical Exam        Assessment & Plan:

## 2024-04-09 NOTE — Progress Notes (Signed)
 Virtual Visit via Video Note  I connected with Pamela Hall on 04/09/24 at  4:00 PM EDT by a video enabled telemedicine application and verified that I am speaking with the correct person using two identifiers.  Location patient: home Location provider:work or home office Persons participating in the virtual visit: patient, provider  I discussed the limitations of evaluation and management by telemedicine and the availability of in person appointments. The patient expressed understanding and agreed to proceed.   HPI: She is being evaluated today for follow-up regarding obesity and weight loss management.  Roughly a month ago she was started on Zepbound  2.5 mg.  She reports that she has been tolerating this medication well without any side effects.she has noticed that it decreases her appetite. She has lost 4 pounds since we placed her on the medication. She would like to increase her dose.   She also reports that she has restarted Repatha  for history of familial hypercholesteremia and is tolerating this medication well.    ROS: See pertinent positives and negatives per HPI.  Past Medical History:  Diagnosis Date   Abnormal Pap smear 2008   COLPO;WAS NORMAL;LAST PAP 10/2011   Anxiety and depression    High cholesterol     Past Surgical History:  Procedure Laterality Date   HYSTEROSCOPY W/ ENDOMETRIAL ABLATION  10/2021   [facility]   TUBAL LIGATION  09/30/2012   Procedure: POST PARTUM TUBAL LIGATION;  Surgeon: Ovid DELENA All, MD;  Location: [facility];  Service: Gynecology;  Laterality: Bilateral;   WISDOM TOOTH EXTRACTION  10/15/2001   ALL 4 EXTRACTED    Family History  Problem Relation Age of Onset   Diabetes type II Maternal Grandmother    Hypertension Maternal Grandmother    Heart disease Maternal Grandmother    Anesthesia problems Maternal Grandmother        ALLERGIC TO GENERAL ANESTHESIA   Other Maternal Grandmother    Breast cancer Maternal Grandmother     Schizophrenia Mother    Bipolar disorder Mother    Heart disease Mother    Heart attack Mother        Current Outpatient Medications:    tirzepatide  5 MG/0.5ML injection vial, Inject 5 mg into the skin once a week., Disp: 2 mL, Rfl: 0   escitalopram  (LEXAPRO ) 10 MG tablet, Take 1 tablet (10 mg total) by mouth daily., Disp: 90 tablet, Rfl: 1   Evolocumab  (REPATHA  SURECLICK) 140 MG/ML SOAJ, Inject 140 mg into the skin every 14 (fourteen) days., Disp: 2 mL, Rfl: 2   ezetimibe  (ZETIA ) 10 MG tablet, Take 1 tablet (10 mg total) by mouth daily., Disp: 90 tablet, Rfl: 3   ketorolac (TORADOL) 10 MG tablet, Take 1 tablet (10 mg total) by mouth every 6 (six) hours as needed., Disp: 20 tablet, Rfl: 0   LORazepam  (ATIVAN ) 1 MG tablet, Take 1 tablet (1 mg total) by mouth as needed for anxiety (for flying)., Disp: 30 tablet, Rfl: 0   methylPREDNISolone  (MEDROL  DOSEPAK) 4 MG TBPK tablet, Follow package insert (Patient not taking: Reported on 04/08/2024), Disp: 21 each, Rfl: 0   norethindrone (CAMILA) 0.35 MG tablet, Take 1 tablet (0.35 mg total) by mouth daily. Take at the same time every day, Disp: 90 tablet, Rfl: 1   rosuvastatin  (CRESTOR ) 10 MG tablet, Take 1 tablet (10 mg total) by mouth daily. (Patient not taking: Reported on 04/08/2024), Disp: 90 tablet, Rfl: 3   valACYclovir  (VALTREX ) 500 MG tablet, Take 1 tablet by mouth once  daily as needed., Disp: 90 tablet, Rfl: 3  EXAM:  VITALS per patient if applicable:  GENERAL: alert, oriented, appears well and in no acute distress  HEENT: atraumatic, conjunttiva clear, no obvious abnormalities on inspection of external nose and ears  NECK: normal movements of the head and neck  LUNGS: on inspection no signs of respiratory distress, breathing rate appears normal, no obvious gross SOB, gasping or wheezing  CV: no obvious cyanosis  MS: moves all visible extremities without noticeable abnormality  PSYCH/NEURO: pleasant and cooperative, no obvious  depression or anxiety, speech and thought processing grossly intact  ASSESSMENT AND PLAN:  Discussed the following assessment and plan:  1. Class 2 obesity (Primary) - Will increase to 5 mg weekly.  - Follow up in 30 days  - tirzepatide  5 MG/0.5ML injection vial; Inject 5 mg into the skin once a week.  Dispense: 2 mL; Refill: 0  2. BMI 40.0-44.9, adult (HCC)  - tirzepatide  5 MG/0.5ML injection vial; Inject 5 mg into the skin once a week.  Dispense: 2 mL; Refill: 0  3. Familial hypercholesterolemia - Continue with Repatha    [name], NP    I discussed the assessment and treatment plan with the patient. The patient was provided an opportunity to ask questions and all were answered. The patient agreed with the plan and demonstrated an understanding of the instructions.   The patient was advised to call back or seek an in-person evaluation if the symptoms worsen or if the condition fails to improve as anticipated.   [name], NP

## 2024-04-13 ENCOUNTER — Ambulatory Visit (HOSPITAL_COMMUNITY)
Admission: RE | Admit: 2024-04-13 | Discharge: 2024-04-13 | Disposition: A | Discharge status: Home / Self Care | Payer: PRIVATE HEALTH INSURANCE | Source: Ambulatory Visit | Attending: Obstetrics and Gynecology | Admitting: Obstetrics and Gynecology

## 2024-04-13 DIAGNOSIS — R102 Pelvic and perineal pain: Secondary | ICD-10-CM | POA: Diagnosis present

## 2024-04-13 DIAGNOSIS — Z9889 Other specified postprocedural states: Secondary | ICD-10-CM | POA: Diagnosis present

## 2024-04-14 ENCOUNTER — Ambulatory Visit: Payer: Self-pay | Admitting: Obstetrics and Gynecology

## 2024-04-14 LAB — CYTOLOGY - PAP
Comment: NEGATIVE
Diagnosis: UNDETERMINED — AB
High risk HPV: NEGATIVE

## 2024-04-27 ENCOUNTER — Encounter: Payer: Self-pay | Admitting: Adult Health

## 2024-04-28 NOTE — Telephone Encounter (Signed)
**Note De-identified  [place] Obfuscation** Please advise 

## 2024-04-29 ENCOUNTER — Other Ambulatory Visit: Payer: Self-pay | Admitting: Adult Health

## 2024-04-29 DIAGNOSIS — F419 Anxiety disorder, unspecified: Secondary | ICD-10-CM

## 2024-04-29 MED ORDER — LORAZEPAM 1 MG PO TABS: 1.0000 mg | ORAL_TABLET | ORAL | 2 refills | Status: AC | PRN

## 2024-05-20 ENCOUNTER — Encounter: Payer: Self-pay | Admitting: Adult Health

## 2024-05-20 ENCOUNTER — Telehealth: Payer: PRIVATE HEALTH INSURANCE | Admitting: Adult Health

## 2024-05-20 VITALS — Wt 251.0 lb

## 2024-05-20 DIAGNOSIS — E66812 Obesity, class 2: Secondary | ICD-10-CM

## 2024-05-20 DIAGNOSIS — Z6841 Body Mass Index (BMI) 40.0 and over, adult: Secondary | ICD-10-CM | POA: Diagnosis not present

## 2024-05-20 DIAGNOSIS — K2100 Gastro-esophageal reflux disease with esophagitis, without bleeding: Secondary | ICD-10-CM

## 2024-05-20 MED ORDER — SUCRALFATE 1 G PO TABS: 1.0000 g | ORAL_TABLET | Freq: Three times a day (TID) | ORAL | 0 refills | Status: AC

## 2024-05-20 MED ORDER — ZEPBOUND 7.5 MG/0.5ML ~~LOC~~ SOAJ: 7.5000 mg | SUBCUTANEOUS | 0 refills | Status: AC

## 2024-05-20 NOTE — Progress Notes (Signed)
 Virtual Visit via Video Note  I connected with Pamela Hall on 05/20/24 at  2:00 PM EDT by a video enabled telemedicine application and verified that I am speaking with the correct person using two identifiers.  Location patient: home Location provider:work or home office Persons participating in the virtual visit: patient, provider  I discussed the limitations of evaluation and management by telemedicine and the availability of in person appointments. The patient expressed understanding and agreed to proceed.   HPI: Pamela Hall is being evaluated today for follow-up regarding obesity and weight loss management.  Roughly two months ago Pamela Hall was started on Zepbound  2.5 mg with titration up to 5 mg.  Pamela Hall reports that since titrating up to the 5 mg Pamela Hall has been suffering from pretty significant acid reflux.  At home Pamela Hall has tried omeprazole, famotidine , Nexium and baking soda water.  Pamela Hall reports that the omeprazole did not help much and that Nexium and baking soda water seemed to provide some improvement that it continues to be quite severe.  Pamela Hall is not eating much during the day, will often have a cup of coffee in the morning and then not eat until lunch and then have a light dinner.  Her appetite has decreased.  Wt Readings from Last 3 Encounters:  05/20/24 251 lb (113.9 kg)  04/08/24 257 lb 12.8 oz (116.9 kg)  03/10/24 261 lb (118.4 kg)     ROS: See pertinent positives and negatives per HPI.  Past Medical History:  Diagnosis Date   Abnormal Pap smear 2008   COLPO;WAS NORMAL;LAST PAP 10/2011   Anxiety and depression    High cholesterol     Past Surgical History:  Procedure Laterality Date   HYSTEROSCOPY W/ ENDOMETRIAL ABLATION  10/2021   [facility]   TUBAL LIGATION  09/30/2012   Procedure: POST PARTUM TUBAL LIGATION;  Surgeon: Ovid DELENA All, MD;  Location: [facility];  Service: Gynecology;  Laterality: Bilateral;   WISDOM TOOTH EXTRACTION  10/15/2001   ALL 4 EXTRACTED     Family History  Problem Relation Age of Onset   Diabetes type II Maternal Grandmother    Hypertension Maternal Grandmother    Heart disease Maternal Grandmother    Anesthesia problems Maternal Grandmother        ALLERGIC TO GENERAL ANESTHESIA   Other Maternal Grandmother    Breast cancer Maternal Grandmother    Schizophrenia Mother    Bipolar disorder Mother    Heart disease Mother    Heart attack Mother        Current Outpatient Medications:    sucralfate  (CARAFATE ) 1 g tablet, Take 1 tablet (1 g total) by mouth 4 (four) times daily -  with meals and at bedtime for 14 days., Disp: 56 tablet, Rfl: 0   tirzepatide  (ZEPBOUND ) 7.5 MG/0.5ML Pen, Inject 7.5 mg into the skin once a week., Disp: 2 mL, Rfl: 0   escitalopram  (LEXAPRO ) 10 MG tablet, Take 1 tablet (10 mg total) by mouth daily., Disp: 90 tablet, Rfl: 1   Evolocumab  (REPATHA  SURECLICK) 140 MG/ML SOAJ, Inject 140 mg into the skin every 14 (fourteen) days., Disp: 2 mL, Rfl: 2   ezetimibe  (ZETIA ) 10 MG tablet, Take 1 tablet (10 mg total) by mouth daily., Disp: 90 tablet, Rfl: 3   ketorolac  (TORADOL ) 10 MG tablet, Take 1 tablet (10 mg total) by mouth every 6 (six) hours as needed., Disp: 20 tablet, Rfl: 0   LORazepam  (ATIVAN ) 1 MG tablet, Take 1 tablet (1 mg total)  by mouth as needed for anxiety (for flying)., Disp: 30 tablet, Rfl: 2   methylPREDNISolone  (MEDROL  DOSEPAK) 4 MG TBPK tablet, Follow package insert (Patient not taking: Reported on 04/08/2024), Disp: 21 each, Rfl: 0   norethindrone  (CAMILA ) 0.35 MG tablet, Take 1 tablet (0.35 mg total) by mouth daily. Take at the same time every day, Disp: 90 tablet, Rfl: 1   rosuvastatin  (CRESTOR ) 10 MG tablet, Take 1 tablet (10 mg total) by mouth daily. (Patient not taking: Reported on 04/08/2024), Disp: 90 tablet, Rfl: 3   valACYclovir  (VALTREX ) 500 MG tablet, Take 1 tablet by mouth once daily as needed., Disp: 90 tablet, Rfl: 3  EXAM:  VITALS per patient if applicable:  GENERAL:  alert, oriented, appears well and in no acute distress  HEENT: atraumatic, conjunttiva clear, no obvious abnormalities on inspection of external nose and ears  NECK: normal movements of the head and neck  LUNGS: on inspection no signs of respiratory distress, breathing rate appears normal, no obvious gross SOB, gasping or wheezing  CV: no obvious cyanosis  MS: moves all visible extremities without noticeable abnormality  PSYCH/NEURO: pleasant and cooperative, no obvious depression or anxiety, speech and thought processing grossly intact  ASSESSMENT AND PLAN:  Discussed the following assessment and plan:  1. Class 2 obesity (Primary) - Will increase to 7.5 weekly.  - Follow up in 1 month - tirzepatide  (ZEPBOUND ) 7.5 MG/0.5ML Pen; Inject 7.5 mg into the skin once a week.  Dispense: 2 mL; Refill: 0  2. BMI 40.0-44.9, adult (HCC) - Pamela Hall has lost about 10 pounds since starting Zepbound  - tirzepatide  (ZEPBOUND ) 7.5 MG/0.5ML Pen; Inject 7.5 mg into the skin once a week.  Dispense: 2 mL; Refill: 0  3. Gastroesophageal reflux disease with esophagitis without hemorrhage - Advised to eat multiple small meals throughout the day to reduce GERD symptoms. Will send in Carafate  to help  - Follow up if not improving  - sucralfate  (CARAFATE ) 1 g tablet; Take 1 tablet (1 g total) by mouth 4 (four) times daily -  with meals and at bedtime for 14 days.  Dispense: 56 tablet; Refill: 0  [name], NP    I discussed the assessment and treatment plan with the patient. The patient was provided an opportunity to ask questions and all were answered. The patient agreed with the plan and demonstrated an understanding of the instructions.   The patient was advised to call back or seek an in-person evaluation if the symptoms worsen or if the condition fails to improve as anticipated.   Sima Lindenberger, NP

## 2024-05-25 ENCOUNTER — Ambulatory Visit: Payer: PRIVATE HEALTH INSURANCE | Admitting: Obstetrics and Gynecology

## 2024-05-25 ENCOUNTER — Encounter: Payer: Self-pay | Admitting: Obstetrics and Gynecology

## 2024-05-25 NOTE — Progress Notes (Signed)
 Patient did not keep her GYN appointment for 05/25/2024.  Bebe Izell Raddle MD Attending [facility])

## 2024-06-18 ENCOUNTER — Other Ambulatory Visit: Payer: Self-pay | Admitting: Adult Health

## 2024-06-18 DIAGNOSIS — E7801 Familial hypercholesterolemia: Secondary | ICD-10-CM

## 2024-09-05 ENCOUNTER — Other Ambulatory Visit: Payer: Self-pay | Admitting: Adult Health

## 2024-10-21 ENCOUNTER — Ambulatory Visit: Payer: PRIVATE HEALTH INSURANCE | Admitting: Adult Health

## 2024-11-03 ENCOUNTER — Other Ambulatory Visit: Payer: Self-pay | Admitting: Adult Health

## 2024-11-03 ENCOUNTER — Encounter: Payer: Self-pay | Admitting: Adult Health

## 2024-11-03 NOTE — Telephone Encounter (Signed)
 Please advise.

## 2024-11-04 ENCOUNTER — Other Ambulatory Visit: Payer: Self-pay | Admitting: Adult Health

## 2024-11-04 MED ORDER — ESCITALOPRAM OXALATE 10 MG PO TABS: 10.0000 mg | ORAL_TABLET | Freq: Every day | ORAL | 1 refills | Status: AC

## 2024-11-10 ENCOUNTER — Ambulatory Visit: Payer: PRIVATE HEALTH INSURANCE | Admitting: Adult Health

## 2024-11-20 ENCOUNTER — Other Ambulatory Visit: Payer: Self-pay | Admitting: Medical Genetics

## 2024-11-26 ENCOUNTER — Ambulatory Visit: Admitting: Adult Health
